# Patient Record
Sex: Female | Born: 1955 | Race: White | Hispanic: No | Marital: Married | Smoking: Former smoker
Health system: Southern US, Community
[De-identification: ages and names within clinical notes are randomized; demographics above are authoritative.]

## PROBLEM LIST (undated history)

## (undated) DIAGNOSIS — Z8669 Personal history of other diseases of the nervous system and sense organs: Secondary | ICD-10-CM

## (undated) DIAGNOSIS — Z8639 Personal history of other endocrine, nutritional and metabolic disease: Secondary | ICD-10-CM

## (undated) DIAGNOSIS — G4730 Sleep apnea, unspecified: Secondary | ICD-10-CM

## (undated) DIAGNOSIS — J45909 Unspecified asthma, uncomplicated: Secondary | ICD-10-CM

## (undated) DIAGNOSIS — Z9889 Other specified postprocedural states: Secondary | ICD-10-CM

## (undated) DIAGNOSIS — T7840XA Allergy, unspecified, initial encounter: Secondary | ICD-10-CM

## (undated) DIAGNOSIS — M1990 Unspecified osteoarthritis, unspecified site: Secondary | ICD-10-CM

## (undated) DIAGNOSIS — Z923 Personal history of irradiation: Secondary | ICD-10-CM

## (undated) DIAGNOSIS — J189 Pneumonia, unspecified organism: Secondary | ICD-10-CM

## (undated) DIAGNOSIS — C50912 Malignant neoplasm of unspecified site of left female breast: Secondary | ICD-10-CM

## (undated) DIAGNOSIS — C50919 Malignant neoplasm of unspecified site of unspecified female breast: Secondary | ICD-10-CM

## (undated) DIAGNOSIS — T8859XA Other complications of anesthesia, initial encounter: Secondary | ICD-10-CM

## (undated) DIAGNOSIS — C73 Malignant neoplasm of thyroid gland: Secondary | ICD-10-CM

## (undated) DIAGNOSIS — Z9221 Personal history of antineoplastic chemotherapy: Secondary | ICD-10-CM

## (undated) DIAGNOSIS — R112 Nausea with vomiting, unspecified: Secondary | ICD-10-CM

## (undated) DIAGNOSIS — R519 Headache, unspecified: Secondary | ICD-10-CM

## (undated) HISTORY — DX: Allergy, unspecified, initial encounter: T78.40XA

## (undated) HISTORY — DX: Malignant neoplasm of thyroid gland: C73

## (undated) HISTORY — DX: Personal history of other diseases of the nervous system and sense organs: Z86.69

## (undated) HISTORY — PX: OTHER SURGICAL HISTORY: SHX169

## (undated) HISTORY — DX: Malignant neoplasm of unspecified site of unspecified female breast: C50.919

## (undated) HISTORY — PX: DILATION AND CURETTAGE OF UTERUS: SHX78

## (undated) HISTORY — DX: Sleep apnea, unspecified: G47.30

## (undated) HISTORY — PX: MYOMECTOMY: SHX85

## (undated) HISTORY — PX: BREAST LUMPECTOMY: SHX2

## (undated) HISTORY — DX: Personal history of other endocrine, nutritional and metabolic disease: Z86.39

## (undated) HISTORY — PX: BREAST SURGERY: SHX581

## (undated) HISTORY — PX: BREAST BIOPSY: SHX20

---

## 2000-03-28 HISTORY — PX: OTHER SURGICAL HISTORY: SHX169

## 2003-03-29 DIAGNOSIS — C50912 Malignant neoplasm of unspecified site of left female breast: Secondary | ICD-10-CM

## 2003-03-29 HISTORY — DX: Malignant neoplasm of unspecified site of left female breast: C50.912

## 2008-11-04 ENCOUNTER — Ambulatory Visit: Payer: Self-pay | Admitting: Otolaryngology

## 2011-01-26 ENCOUNTER — Ambulatory Visit: Payer: Self-pay | Admitting: Internal Medicine

## 2011-02-21 ENCOUNTER — Ambulatory Visit: Payer: Self-pay | Admitting: Oncology

## 2011-02-23 ENCOUNTER — Ambulatory Visit: Payer: Self-pay | Admitting: Internal Medicine

## 2011-03-08 ENCOUNTER — Ambulatory Visit: Payer: Self-pay | Admitting: Oncology

## 2011-03-29 ENCOUNTER — Ambulatory Visit: Payer: Self-pay | Admitting: Oncology

## 2012-10-02 ENCOUNTER — Encounter: Payer: Self-pay | Admitting: Family Medicine

## 2012-10-02 ENCOUNTER — Ambulatory Visit (INDEPENDENT_AMBULATORY_CARE_PROVIDER_SITE_OTHER): Payer: 59 | Admitting: Family Medicine

## 2012-10-02 ENCOUNTER — Other Ambulatory Visit (HOSPITAL_COMMUNITY)
Admission: RE | Admit: 2012-10-02 | Discharge: 2012-10-02 | Disposition: A | Discharge status: Home / Self Care | Payer: 59 | Source: Ambulatory Visit | Attending: Family Medicine | Admitting: Family Medicine

## 2012-10-02 VITALS — BP 130/82 | HR 68 | Temp 97.7°F | Ht 67.25 in | Wt 226.0 lb

## 2012-10-02 DIAGNOSIS — Z136 Encounter for screening for cardiovascular disorders: Secondary | ICD-10-CM

## 2012-10-02 DIAGNOSIS — Z1151 Encounter for screening for human papillomavirus (HPV): Secondary | ICD-10-CM | POA: Insufficient documentation

## 2012-10-02 DIAGNOSIS — Z01419 Encounter for gynecological examination (general) (routine) without abnormal findings: Secondary | ICD-10-CM | POA: Insufficient documentation

## 2012-10-02 DIAGNOSIS — Z0000 Encounter for general adult medical examination without abnormal findings: Secondary | ICD-10-CM | POA: Insufficient documentation

## 2012-10-02 DIAGNOSIS — Z853 Personal history of malignant neoplasm of breast: Secondary | ICD-10-CM | POA: Insufficient documentation

## 2012-10-02 LAB — COMPREHENSIVE METABOLIC PANEL
AST: 17 U/L (ref 0–37)
Albumin: 3.9 g/dL (ref 3.5–5.2)
Alkaline Phosphatase: 103 U/L (ref 39–117)
Potassium: 4.2 mEq/L (ref 3.5–5.1)
Sodium: 140 mEq/L (ref 135–145)
Total Protein: 7.2 g/dL (ref 6.0–8.3)

## 2012-10-02 LAB — TSH: TSH: 0.47 u[IU]/mL (ref 0.35–5.50)

## 2012-10-02 LAB — LDL CHOLESTEROL, DIRECT: Direct LDL: 147.8 mg/dL

## 2012-10-02 LAB — LIPID PANEL: Total CHOL/HDL Ratio: 4

## 2012-10-02 NOTE — Addendum Note (Signed)
Addended by: Eliezer Bottom on: 10/02/2012 11:47 AM   Modules accepted: Orders

## 2012-10-02 NOTE — Patient Instructions (Addendum)
It was wonderful to meet you.  Please stop by to see Shirlee Limerick on your way out to set up your mammogram.  Please call your other doctor to have your records and colonoscopy results to me.  We will call you with your lab results and you can also view them online.  Try taking Benadryl 25 mg nightly to see if it helps with the itching and sinus drainage.

## 2012-10-02 NOTE — Progress Notes (Signed)
Subjective:    Patient ID: Ariana Lopez, female    DOB: 1955-12-25, 57 y.o.   MRN: [id number]  HPI  57 yo pleasant female here to establish care.  G1P1.  Unsure when she last had a pap smear.  No h/o abnormal pap smears.  H/o left breast cancer ( diagnosed in 2002). S/p lumpectomy, radiation and chemo. In remission for over 7 years.  Last mammogram in 09/2011 in [place] (brings in those records). Does not have an oncologist year.  Initially saw someone when she moved here two years ago but has not seen anyone since.  She thinks she is up to date on mammogram.  Patient Active Problem List   Diagnosis Date Noted  . History of breast cancer 10/02/2012  . Routine general medical examination at a health care facility 10/02/2012  . Encounter for routine gynecological examination 10/02/2012   Past Medical History  Diagnosis Date  . Breast cancer    Past Surgical History  Procedure Laterality Date  . Left breast lumpectomy Left 2002   History  Substance Use Topics  . Smoking status: Former Games developer  . Smokeless tobacco: Not on file  . Alcohol Use: Not on file   No family history on file. No Known Allergies No current outpatient prescriptions on file prior to visit.   No current facility-administered medications on file prior to visit.   The PMH, PSH, Social History, Family History, Medications, and allergies have been reviewed in [facility] [place] [facility], and have been updated if relevant.     Review of Systems See HPI No changes in bowel habits No blood in stool No post menopausal bleeding +neuropathy from radiation     Objective:   Physical Exam BP 130/82  Pulse 68  Temp(Src) 97.7 F (36.5 C)  Ht 5' 7.25" (1.708 m)  Wt 226 lb (102.513 kg)  BMI 35.14 kg/m2  General:  Well-developed,well-nourished,in no acute distress; alert,appropriate and cooperative throughout examination Head:  normocephalic and atraumatic.   Eyes:  vision grossly intact, pupils equal, pupils round,  and pupils reactive to light.   Ears:  R ear normal and L ear normal.   Nose:  no external deformity.   Mouth:  good dentition.   Neck:  No deformities, masses, or tenderness noted. Breasts:  No mass, nodules, thickening, tenderness, bulging, retraction, inflamation, nipple discharge or skin changes noted.   Lungs:  Normal respiratory effort, chest expands symmetrically. Lungs are clear to auscultation, no crackles or wheezes. Heart:  Normal rate and regular rhythm. S1 and S2 normal without gallop, murmur, click, rub or other extra sounds. Abdomen:  Bowel sounds positive,abdomen soft and non-tender without masses, organomegaly or hernias noted. Rectal:  no external abnormalities.   Genitalia:  Pelvic Exam:        External: normal female genitalia without lesions or masses        Vagina: normal without lesions or masses        Cervix: normal without lesions or masses        Adnexa: normal bimanual exam without masses or fullness        Uterus: normal by palpation        Pap smear: performed Msk:  No deformity or scoliosis noted of thoracic or lumbar spine.   Extremities:  No clubbing, cyanosis, edema, or deformity noted with normal full range of motion of all joints.   Neurologic:  alert & oriented X3 and gait normal.   Skin:  Intact without suspicious lesions or rashes Cervical Nodes:  No lymphadenopathy noted Axillary Nodes:  No palpable lymphadenopathy Psych:  Cognition and judgment appear intact. Alert and cooperative with normal attention span and concentration. No apparent delusions, illusions, hallucinations        Assessment & Plan:  1. History of breast cancer Order mammogram. Will refer to oncology once mammogram report returned. - MM Digital Diagnostic Bilat; Future  2. Routine general medical examination at a health care facility Reviewed preventive care protocols, scheduled due services, and updated immunizations Discussed nutrition, exercise, diet, and healthy  lifestyle.  - Comprehensive metabolic panel - TSH - T4, Free  3. Encounter for routine gynecological examination Pap smear  4. Screening for ischemic heart disease  - Lipid Panel

## 2012-10-18 ENCOUNTER — Ambulatory Visit: Payer: Self-pay | Admitting: Family Medicine

## 2012-10-23 ENCOUNTER — Encounter: Payer: Self-pay | Admitting: Family Medicine

## 2012-10-23 ENCOUNTER — Encounter: Payer: Self-pay | Admitting: *Deleted

## 2012-11-13 ENCOUNTER — Ambulatory Visit (INDEPENDENT_AMBULATORY_CARE_PROVIDER_SITE_OTHER): Payer: 59 | Admitting: Family Medicine

## 2012-11-13 ENCOUNTER — Encounter: Payer: Self-pay | Admitting: Family Medicine

## 2012-11-13 VITALS — BP 110/64 | HR 60 | Temp 97.8°F | Ht 67.25 in | Wt 226.0 lb

## 2012-11-13 DIAGNOSIS — E66811 Obesity, class 1: Secondary | ICD-10-CM

## 2012-11-13 DIAGNOSIS — L299 Pruritus, unspecified: Secondary | ICD-10-CM

## 2012-11-13 DIAGNOSIS — L918 Other hypertrophic disorders of the skin: Secondary | ICD-10-CM | POA: Insufficient documentation

## 2012-11-13 DIAGNOSIS — L909 Atrophic disorder of skin, unspecified: Secondary | ICD-10-CM

## 2012-11-13 DIAGNOSIS — E669 Obesity, unspecified: Secondary | ICD-10-CM

## 2012-11-13 HISTORY — DX: Obesity, class 1: E66.811

## 2012-11-13 HISTORY — DX: Obesity, unspecified: E66.9

## 2012-11-13 NOTE — Patient Instructions (Addendum)
Great to see you. Ok to shower normally tomorrow.  Please use neosporin on the area if there is any irritation.  Please stop by to see Shirlee Limerick on your way out to set up your referrals.

## 2012-11-13 NOTE — Addendum Note (Signed)
Addended by: Eliezer Bottom on: 11/13/2012 10:36 AM   Modules accepted: Orders

## 2012-11-13 NOTE — Addendum Note (Signed)
Addended by: Eliezer Bottom on: 11/13/2012 10:23 AM   Modules accepted: Orders

## 2012-11-13 NOTE — Progress Notes (Signed)
  Subjective:    Patient ID: Ariana Lopez, female    DOB: 23-Nov-1955, 57 y.o.   MRN: [id number]  HPI Very pleasant 57 yo female here for:  1.  Skin tag- under right axilla.  Gets trapped in clothing and can be very irritated.   A few weeks ago, was painful and enlarged.  She though it was infected but it has improved.  2.  Bilateral ear itching, left > great.  Tried OTC hydrocortisone with only mild improvement.  No ear pain, ear drainage, or hearing loss.  3.  Weight gain- walks every day.  Working very hard to lose weight but keeps gaining.   Wt Readings from Last 3 Encounters:  11/13/12 226 lb (102.513 kg)  10/02/12 226 lb (102.513 kg)     Patient Active Problem List   Diagnosis Date Noted  . Ear itching 11/13/2012  . Skin tag 11/13/2012  . History of breast cancer 10/02/2012  . Routine general medical examination at a health care facility 10/02/2012  . Encounter for routine gynecological examination 10/02/2012   Past Medical History  Diagnosis Date  . Breast cancer    Past Surgical History  Procedure Laterality Date  . Left breast lumpectomy Left 2002   History  Substance Use Topics  . Smoking status: Former Games developer  . Smokeless tobacco: Not on file  . Alcohol Use: Not on file   Family History  Problem Relation Age of Onset  . Cancer Sister 84    breast   No Known Allergies No current outpatient prescriptions on file prior to visit.   No current facility-administered medications on file prior to visit.   The PMH, PSH, Social History, Family History, Medications, and allergies have been reviewed in [facility], and have been updated if relevant.    Review of Systems See HPI    Objective:   Physical Exam BP 110/64  Pulse 60  Temp(Src) 97.8 F (36.6 C)  Ht 5' 7.25" (1.708 m)  Wt 226 lb (102.513 kg)  BMI 35.14 kg/m2 Gen:  Alert, pleasant, NAD Skin:  One large benign skin tag noted under right axilla HEENT: Tms clear bilaterally, no tenderness with  movement of tragus.       Assessment & Plan:   1. Ear itching Persistent.  No indication of otitis externa.  Failed hydrocotisone.  Will refer to ENT for further evaluation. - Ambulatory referral to ENT  2. Skin tag   Skin tag nipped off using Betadine for cleansing and sterile iris scissors. Local anesthesia was  used.    3. Obesity, morbid Deteriorated.  Refer to nutritionist.  - Amb ref to Medical Nutrition Therapy-MNT

## 2012-12-05 ENCOUNTER — Encounter: Payer: Self-pay | Admitting: Family Medicine

## 2012-12-05 ENCOUNTER — Ambulatory Visit (INDEPENDENT_AMBULATORY_CARE_PROVIDER_SITE_OTHER)
Admission: RE | Admit: 2012-12-05 | Discharge: 2012-12-05 | Disposition: A | Discharge status: Home / Self Care | Payer: 59 | Source: Ambulatory Visit | Attending: Family Medicine | Admitting: Family Medicine

## 2012-12-05 ENCOUNTER — Ambulatory Visit (INDEPENDENT_AMBULATORY_CARE_PROVIDER_SITE_OTHER): Payer: 59 | Admitting: Family Medicine

## 2012-12-05 VITALS — BP 120/70 | HR 64 | Temp 97.9°F | Wt 226.0 lb

## 2012-12-05 DIAGNOSIS — M545 Low back pain: Secondary | ICD-10-CM

## 2012-12-05 DIAGNOSIS — G8929 Other chronic pain: Secondary | ICD-10-CM | POA: Insufficient documentation

## 2012-12-05 NOTE — Progress Notes (Signed)
  Subjective:    Patient ID: Ariana Lopez, female    DOB: Nov 27, 1955, 57 y.o.   MRN: [id number]  HPI  Very pleasant 57 yo female here for low back pain with radiculopathy on and off for years. Lately, it is waking her up at night.  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 Pain runs down thigh and side of lower leg- alternates between left and right.  Back pain is central.  No changes in bowel or bladder function.  No LE weakness.  Pain is worsened at night and first thing in the morning.  She feels like the more weight she gains, the worse it is.  Could not tolerate gabapentin or Lyrica in past- nausea. Does not want prednisone due to weight gain.  Patient Active Problem List   Diagnosis Date Noted  . Low back pain potentially associated with radiculopathy 12/05/2012  . Ear itching 11/13/2012  . Skin tag 11/13/2012  . Obesity, morbid 11/13/2012  . History of breast cancer 10/02/2012  . Routine general medical examination at a health care facility 10/02/2012  . Encounter for routine gynecological examination 10/02/2012   Past Medical History  Diagnosis Date  . Breast cancer    Past Surgical History  Procedure Laterality Date  . Left breast lumpectomy Left 2002   History  Substance Use Topics  . Smoking status: Former Games developer  . Smokeless tobacco: Not on file  . Alcohol Use: Not on file   Family History  Problem Relation Age of Onset  . Cancer Sister 80    breast   No Known Allergies No current outpatient prescriptions on file prior to visit.   No current facility-administered medications on file prior to visit.   The PMH, PSH, Social History, Family History, Medications, and allergies have been reviewed in [facility], and have been updated if relevant.   Review of Systems See HPI No known injury No fever or chills    Objective:   Physical Exam BP 120/70  Pulse 64  Temp(Src) 97.9 F (36.6 C)  Wt 226 lb  (102.513 kg)  BMI 35.14 kg/m2  SpO2 95%  General:  Well-developed,well-nourished,in no acute distress; alert,appropriate and cooperative throughout examination Head:  normocephalic and atraumatic.   Msk:  No deformity or scoliosis noted of thoracic or lumbar spine.  +SLR bilaterally, pos fabers left  Extremities:  No clubbing, cyanosis, edema, or deformity noted with normal full range of motion of all joints.   Psych:  Cognition and judgment appear intact. Alert and cooperative with normal attention span and concentration. No apparent delusions, illusions, hallucinations     Assessment & Plan:  1. Low back pain potentially associated with radiculopathy New- ? Herniated disc with DJD of spine. Will start with lumbar xray. Given duration of symptoms, refer to PT. Advised NSAIDs with food as she is refusing other medications. - Ambulatory referral to Physical Therapy - DG Lumbar Spine Complete; Future

## 2012-12-05 NOTE — Patient Instructions (Addendum)
Good to see you. We will call you with your xray results and physical therapy referral.

## 2012-12-13 ENCOUNTER — Encounter: Payer: Self-pay | Admitting: Family Medicine

## 2012-12-13 ENCOUNTER — Telehealth: Payer: Self-pay

## 2012-12-13 ENCOUNTER — Ambulatory Visit (INDEPENDENT_AMBULATORY_CARE_PROVIDER_SITE_OTHER): Payer: 59 | Admitting: Family Medicine

## 2012-12-13 VITALS — BP 110/62 | HR 82 | Temp 97.8°F | Ht 67.25 in | Wt 223.0 lb

## 2012-12-13 DIAGNOSIS — R110 Nausea: Secondary | ICD-10-CM | POA: Insufficient documentation

## 2012-12-13 DIAGNOSIS — R197 Diarrhea, unspecified: Secondary | ICD-10-CM | POA: Insufficient documentation

## 2012-12-13 MED ORDER — PROMETHAZINE HCL 25 MG PO TABS: 25.0000 mg | ORAL_TABLET | Freq: Three times a day (TID) | ORAL | Status: DC | PRN

## 2012-12-13 MED ORDER — PROMETHAZINE HCL 25 MG/ML IJ SOLN
25.0000 mg | Freq: Once | INTRAMUSCULAR | Status: AC
  Administered 2012-12-13: 25 mg via INTRAMUSCULAR

## 2012-12-13 NOTE — Progress Notes (Signed)
  Subjective:    Patient ID: Ariana Lopez, female    DOB: April 11, 1955, 57 y.o.   MRN: [id number]  HPI 57 yo pleasant female here for nausea without vomiting and diarrhea.  All symptoms started the night after she was given Augmentin by ENT for "swollen glands."  Feels fevers, achy.  Has a headache.  No abdominal pain or dysuria.  Has had Augmentin without these symptoms in past.  Patient Active Problem List   Diagnosis Date Noted  . Nausea alone 12/13/2012  . Diarrhea 12/13/2012  . Low back pain potentially associated with radiculopathy 12/05/2012  . Ear itching 11/13/2012  . Skin tag 11/13/2012  . Obesity, morbid 11/13/2012  . History of breast cancer 10/02/2012  . Routine general medical examination at a health care facility 10/02/2012  . Encounter for routine gynecological examination 10/02/2012   Past Medical History  Diagnosis Date  . Breast cancer    Past Surgical History  Procedure Laterality Date  . Left breast lumpectomy Left 2002   History  Substance Use Topics  . Smoking status: Former Games developer  . Smokeless tobacco: Not on file  . Alcohol Use: Not on file   Family History  Problem Relation Age of Onset  . Cancer Sister 76    breast   No Known Allergies No current outpatient prescriptions on file prior to visit.   No current facility-administered medications on file prior to visit.   The PMH, PSH, Social History, Family History, Medications, and allergies have been reviewed in [facility], and have been updated if relevant.    Review of Systems See HPI No blood in stool +cough    Objective:   Physical Exam BP 110/62  Pulse 82  Temp(Src) 97.8 F (36.6 C)  Ht 5' 7.25" (1.708 m)  Wt 223 lb (101.152 kg)  BMI 34.67 kg/m2 Gen:  Alert, appears fatigued but non toxic HEENT:  MMM Abd:  Soft, NT, pos BS Resp:  CTA bilaterally    Assessment & Plan:  1. Nausea alone Gastroenteritis vs reaction to Augmentin.  Does have chills, malaise but that could be  from previous infection being treated with Augmentin. I do think this is more likely viral gastroenteritis. IM phenergan given in office and rx sent to pharmacy for po phenergan. Advised hydration and stopping Augmentin.  She will call me tomorrow with an update.  2. Diarrhea Likely related to above.

## 2012-12-13 NOTE — Telephone Encounter (Signed)
Pt walked in;pt said for 2 days had severe nausea and diarrhea, h/a and ? Fever. Pt feels very weak today.pt is able to keep liquids and some food down. Pt to see Dr Dayton Martes today 9:30 am.

## 2012-12-13 NOTE — Patient Instructions (Addendum)
Good to see you. Please feel better. Stay hydrated.  Phenergan as needed for nausea. STOP taking Augmentin. Call me tomorrow with an update.

## 2012-12-13 NOTE — Addendum Note (Signed)
Addended by: Eliezer Bottom on: 12/13/2012 10:20 AM   Modules accepted: Orders

## 2012-12-14 ENCOUNTER — Telehealth: Payer: Self-pay

## 2012-12-14 MED ORDER — AZITHROMYCIN 250 MG PO TABS: ORAL_TABLET | ORAL | Status: DC

## 2012-12-14 NOTE — Telephone Encounter (Signed)
Pt said she feels better today; still has non productive cough, hurts in stomach when she coughs, chest congestion and slight h/a today.pt has no fever but goes from hot to cold feeling and still feels tired. Pt thinks Augmentin was too strong for her and request another antibiotic sent to [facility].Please advise.

## 2012-12-14 NOTE — Telephone Encounter (Signed)
Spoke with patient and advised results   

## 2012-12-14 NOTE — Telephone Encounter (Signed)
I am glad she is feeling a little better. Let's try a Zpack as it would cover sinus and resp flora. Rx sent.

## 2012-12-21 ENCOUNTER — Ambulatory Visit: Payer: Self-pay | Admitting: Family Medicine

## 2012-12-26 ENCOUNTER — Ambulatory Visit: Payer: Self-pay | Admitting: Family Medicine

## 2013-01-04 ENCOUNTER — Ambulatory Visit (INDEPENDENT_AMBULATORY_CARE_PROVIDER_SITE_OTHER): Payer: 59 | Admitting: *Deleted

## 2013-01-04 DIAGNOSIS — Z23 Encounter for immunization: Secondary | ICD-10-CM

## 2013-01-25 ENCOUNTER — Ambulatory Visit (INDEPENDENT_AMBULATORY_CARE_PROVIDER_SITE_OTHER): Payer: 59 | Admitting: Family Medicine

## 2013-01-25 ENCOUNTER — Encounter: Payer: Self-pay | Admitting: Family Medicine

## 2013-01-25 VITALS — BP 104/70 | HR 71 | Temp 97.8°F | Ht 67.25 in | Wt 224.2 lb

## 2013-01-25 DIAGNOSIS — M545 Low back pain: Secondary | ICD-10-CM

## 2013-01-25 DIAGNOSIS — B372 Candidiasis of skin and nail: Secondary | ICD-10-CM | POA: Insufficient documentation

## 2013-01-25 MED ORDER — NYSTATIN 100000 UNIT/GM EX CREA: TOPICAL_CREAM | Freq: Two times a day (BID) | CUTANEOUS | Status: DC

## 2013-01-25 MED ORDER — MELOXICAM 15 MG PO TABS: 15.0000 mg | ORAL_TABLET | Freq: Every day | ORAL | Status: DC

## 2013-01-25 NOTE — Progress Notes (Signed)
Subjective:    Patient ID: Ariana Lopez, female    DOB: Feb 01, 1956, 57 y.o.   MRN: [id number]  HPI  57 year old female pt of Dr. Elmer Sow with history of low back pain with radiculopathy presetns for the following:  1: low back pain with radiculopathy:low back pain with radiculopathy on and off for years.  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  Seen by PCP on 11/2012 for worsening symptoms of low back pain.  Summary of that OV is as follows: Pain runs down thigh and side of lower leg- alternates between left and right. Back pain is central.  No changes in bowel or bladder function.  No LE weakness.  Pain is worsened at night and first thing in the morning. She feels like the more weight she gains, the worse it is.   Could not tolerate gabapentin or Lyrica in past when tried for chemo SE- nausea.  Does not want prednisone due to weight gain.  No previous back surgery.  X-ray on 12/05/2012 showed: Normal lumbar segmentation. Bone mineralization is within normal  limits. Vertebral height and alignment within normal limits.  Relatively preserved disc spaces. Sacral a ala and SI joints within  normal limits. No pars fracture. Visualized lower thoracic levels  appear intact.   Referred to PT.  She has noted some improvement... 20%     Today she states she still has pain in low back (4-8/10 on pain scale)... Initially on left side but now pain radiates into left and right leg at times.  Pain now ongoing for 6 onths. No change with position just hurts all the time. Pain worse with leaning forward. Not worse with standing, just worse with moving. She has good and bad days. Numbness gone in feet. Using aleve for pain..has not used other medications.   2: New onset of rash in groin x 1 month: dark red spot on right inner thigh, occ itching. No exposures. No treatments.    Review of Systems  Constitutional: Negative for fever  and fatigue.  HENT: Negative for ear pain.   Eyes: Negative for pain.  Respiratory: Negative for chest tightness and shortness of breath.   Cardiovascular: Negative for chest pain, palpitations and leg swelling.  Gastrointestinal: Negative for abdominal pain.  Genitourinary: Negative for dysuria.       Objective:   Physical Exam  Constitutional: Vital signs are normal. She appears well-developed and well-nourished. She is cooperative.  Non-toxic appearance. She does not appear ill. No distress.  HENT:  Head: Normocephalic.  Right Ear: Hearing, tympanic membrane, external ear and ear canal normal. Tympanic membrane is not erythematous, not retracted and not bulging.  Left Ear: Hearing, tympanic membrane, external ear and ear canal normal. Tympanic membrane is not erythematous, not retracted and not bulging.  Nose: No mucosal edema or rhinorrhea. Right sinus exhibits no maxillary sinus tenderness and no frontal sinus tenderness. Left sinus exhibits no maxillary sinus tenderness and no frontal sinus tenderness.  Mouth/Throat: Uvula is midline, oropharynx is clear and moist and mucous membranes are normal.  Eyes: Conjunctivae, EOM and lids are normal. Pupils are equal, round, and reactive to light. Lids are everted and swept, no foreign bodies found.  Neck: Trachea normal and normal range of motion. Neck supple. Carotid bruit is not present. No mass and no thyromegaly present.  Cardiovascular: Normal rate, regular rhythm, S1 normal, S2 normal, normal heart sounds, intact distal pulses and normal pulses.  Exam  reveals no gallop and no friction rub.   No murmur heard. Pulmonary/Chest: Effort normal and breath sounds normal. Not tachypneic. No respiratory distress. She has no decreased breath sounds. She has no wheezes. She has no rhonchi. She has no rales.  Abdominal: Soft. Normal appearance and bowel sounds are normal. There is no tenderness.  Musculoskeletal:       Lumbar back: She exhibits  decreased range of motion, tenderness and bony tenderness. She exhibits no swelling.  ttp in left buttock, neg SLR, neg faber's  Neurological: She is alert.  Skin: Skin is warm, dry and intact. No rash noted. Rash is not papular, not nodular, not pustular and not vesicular.  Erythematous beefy rash in right groin, small area ion left and under breasts B.  No flake  Psychiatric: Her speech is normal and behavior is normal. Judgment and thought content normal. Her mood appears not anxious. Cognition and memory are normal. She does not exhibit a depressed mood.          Assessment & Plan:

## 2013-01-25 NOTE — Assessment & Plan Note (Signed)
Pt not interested in PM and R referral given she is not interested in injections.  No clear indications for MRI or surgical need. Continue PT, work on weight loss and core strengthening.  Info given on exercises. NSAIDs, heat... Use meloxicam prn.

## 2013-01-25 NOTE — Assessment & Plan Note (Signed)
Treat with topical antifungal. 

## 2013-01-25 NOTE — Patient Instructions (Signed)
Apply cream to rash for yeast infection. Start meloxicam daily then as needed for low back pain.  Return to PT for further treatment. Work on Designer, fashion/clothing and weight loss.  Follow up if not continuing to improve in next 2-4 weeks.

## 2013-04-15 ENCOUNTER — Encounter: Payer: Self-pay | Admitting: Family Medicine

## 2013-04-15 ENCOUNTER — Ambulatory Visit (INDEPENDENT_AMBULATORY_CARE_PROVIDER_SITE_OTHER): Payer: 59 | Admitting: Family Medicine

## 2013-04-15 VITALS — BP 122/74 | HR 69 | Temp 97.9°F | Ht 67.0 in | Wt 230.0 lb

## 2013-04-15 DIAGNOSIS — B372 Candidiasis of skin and nail: Secondary | ICD-10-CM

## 2013-04-15 DIAGNOSIS — G8929 Other chronic pain: Secondary | ICD-10-CM

## 2013-04-15 DIAGNOSIS — M5416 Radiculopathy, lumbar region: Principal | ICD-10-CM

## 2013-04-15 DIAGNOSIS — IMO0002 Reserved for concepts with insufficient information to code with codable children: Secondary | ICD-10-CM

## 2013-04-15 MED ORDER — DEXAMETHASONE SODIUM PHOSPHATE 10 MG/ML IJ SOLN
10.0000 mg | Freq: Once | INTRAMUSCULAR | Status: AC
  Administered 2013-04-15: 10 mg via INTRAVENOUS

## 2013-04-15 MED ORDER — NYSTATIN 100000 UNIT/GM EX POWD: CUTANEOUS | Status: DC

## 2013-04-15 MED ORDER — HYDROCODONE-ACETAMINOPHEN 5-325 MG PO TABS: 1.0000 | ORAL_TABLET | Freq: Four times a day (QID) | ORAL | Status: DC | PRN

## 2013-04-15 NOTE — Addendum Note (Signed)
Addended by: Modena Nunnery on: 04/15/2013 12:43 PM   Modules accepted: Orders

## 2013-04-15 NOTE — Assessment & Plan Note (Signed)
Persistent. Decadron IM x 1 in office to decrease inflammation. Failed PT-refer to ortho.

## 2013-04-15 NOTE — Progress Notes (Signed)
  Subjective:    Patient ID: Ariana Lopez, female    DOB: [birth year], 58 y.o.   MRN: [id number]  HPI  Very pleasant 58 yo female here for follow uplow back pain with radiculopathy on and off for years.  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 Pain runs down thigh and side of lower leg- alternates between left and right.  Back pain is central.  No changes in bowel or bladder function.  No LE weakness.  Could not tolerate gabapentin or Lyrica in past- nausea.  Saw her for this in 11/2012- Lumbar xray-  CLINICAL DATA: 58 year old female low back pain.  EXAM:  LUMBAR SPINE - COMPLETE 4+ VIEW  COMPARISON: None.  FINDINGS:  Normal lumbar segmentation. Bone mineralization is within normal  limits. Vertebral height and alignment within normal limits.  Relatively preserved disc spaces. Sacral a ala and SI joints within  normal limits. No pars fracture. Visualized lower thoracic levels  appear intact.  IMPRESSION:  Negative radiographic appearance of the lumbar spine  Referred her to PT.  She completed 8 sessions and felt this actually worsened her pain so has since stopped it.  Saw Dr. Jacinto Reap- was given nystatin cream to use for rash.  Has helped in groin area but rash persists under her breasts bilaterally.  Patient Active Problem List   Diagnosis Date Noted  . Candidal intertrigo 01/25/2013  . Nausea alone 12/13/2012  . Diarrhea 12/13/2012  . Chronic radicular low back pain 12/05/2012  . Obesity, morbid 11/13/2012  . History of breast cancer 10/02/2012  . Routine general medical examination at a health care facility 10/02/2012  . Encounter for routine gynecological examination 10/02/2012   Past Medical History  Diagnosis Date  . Breast cancer    Past Surgical History  Procedure Laterality Date  . Left breast lumpectomy Left 2002   History  Substance Use Topics  . Smoking status: Former Research scientist (life sciences)  . Smokeless tobacco:  Never Used  . Alcohol Use: Yes     Comment: occ   Family History  Problem Relation Age of Onset  . Cancer Sister 17    breast   No Known Allergies No current outpatient prescriptions on file prior to visit.   No current facility-administered medications on file prior to visit.   The PMH, PSH, Social History, Family History, Medications, and allergies have been reviewed in [facility], and have been updated if relevant.   Review of Systems See HPI No known injury No fever or chills    Objective:   Physical Exam BP 122/74  Pulse 69  Temp(Src) 97.9 F (36.6 C) (Oral)  Ht 5\' 7"  (1.702 m)  Wt 230 lb (104.327 kg)  BMI 36.01 kg/m2  SpO2 94%  General:  Well-developed,well-nourished,in no acute distress; alert,appropriate and cooperative throughout examination Head:  normocephalic and atraumatic.   Msk:  No deformity or scoliosis noted of thoracic or lumbar spine.  +SLR bilaterally, pos fabers left  Extremities:  No clubbing, cyanosis, edema, or deformity noted with normal full range of motion of all joints.   Psych:  Cognition and judgment appear intact. Alert and cooperative with normal attention span and concentration. No apparent delusions, illusions, hallucinations Skin:  Pink plaques under breasts bilaterally, no skin breakdown or drainage.     Assessment & Plan:

## 2013-04-15 NOTE — Assessment & Plan Note (Signed)
Persistent. Nystatin powder. Call or return to clinic prn if these symptoms worsen or fail to improve as anticipated.

## 2013-04-15 NOTE — Patient Instructions (Signed)
Good to see you.  We will call you with a referral to an orthopedist.  norco as needed for severe pain.  Please call me with an update next week.

## 2013-04-15 NOTE — Progress Notes (Signed)
Pre-visit discussion using our clinic review tool. No additional management support is needed unless otherwise documented below in the visit note.  

## 2013-05-03 ENCOUNTER — Ambulatory Visit (INDEPENDENT_AMBULATORY_CARE_PROVIDER_SITE_OTHER): Payer: 59 | Admitting: Family Medicine

## 2013-05-03 ENCOUNTER — Encounter: Payer: Self-pay | Admitting: Family Medicine

## 2013-05-03 VITALS — BP 142/68 | HR 86 | Temp 98.8°F | Ht 67.0 in | Wt 232.0 lb

## 2013-05-03 DIAGNOSIS — J019 Acute sinusitis, unspecified: Secondary | ICD-10-CM | POA: Insufficient documentation

## 2013-05-03 DIAGNOSIS — H6980 Other specified disorders of Eustachian tube, unspecified ear: Secondary | ICD-10-CM

## 2013-05-03 MED ORDER — AZITHROMYCIN 250 MG PO TABS: ORAL_TABLET | ORAL | Status: DC

## 2013-05-03 NOTE — Progress Notes (Signed)
Subjective:    Patient ID: Ariana Lopez, female    DOB: [birth year], 58 y.o.   MRN: [id number]  HPI Here with L ear pain  Also has a headache -mostly over forehead   Has had chills Has not taken her temp   occ sneezing  Nothing major  No purulent d/c Throat feels scratchy on the L side   Has ENT - has hx of eczema in ear - this is what it feels like  Also itching  Has mometason .1% solutio  4 drops into ear when symptoms are bad  She last used it about 2 days ago   Patient Active Problem List   Diagnosis Date Noted  . Acute sinusitis 05/03/2013  . ETD (eustachian tube dysfunction) 05/03/2013  . Candidal intertrigo 01/25/2013  . Nausea alone 12/13/2012  . Diarrhea 12/13/2012  . Chronic radicular low back pain 12/05/2012  . Obesity, morbid 11/13/2012  . History of breast cancer 10/02/2012  . Routine general medical examination at a health care facility 10/02/2012  . Encounter for routine gynecological examination 10/02/2012   Past Medical History  Diagnosis Date  . Breast cancer    Past Surgical History  Procedure Laterality Date  . Left breast lumpectomy Left 2002   History  Substance Use Topics  . Smoking status: Former Research scientist (life sciences)  . Smokeless tobacco: Never Used  . Alcohol Use: Yes     Comment: occ   Family History  Problem Relation Age of Onset  . Cancer Sister 85    breast   Allergies  Allergen Reactions  . Augmentin [Amoxicillin-Pot Clavulanate]     Insomnia/headache /nausea    Current Outpatient Prescriptions on File Prior to Visit  Medication Sig Dispense Refill  . HYDROcodone-acetaminophen (NORCO) 5-325 MG per tablet Take 1 tablet by mouth every 6 (six) hours as needed for moderate pain or severe pain.  6 tablet  0  . nystatin (MYCOSTATIN/NYSTOP) 100000 UNIT/GM POWD Apply under breast four times daily  30 Bottle  0   No current facility-administered medications on file prior to visit.      Review of Systems Review of Systems    Constitutional: Negative for fever, appetite change, fatigue and unexpected weight change.  Eyes: Negative for pain and visual disturbance.  ENT pos for ear and facial pain / neg for st Respiratory: Negative for cough and shortness of breath.   Cardiovascular: Negative for cp or palpitations    Gastrointestinal: Negative for nausea, diarrhea and constipation.  Genitourinary: Negative for urgency and frequency.  Skin: Negative for pallor or rash   Neurological: Negative for weakness, light-headedness, numbness and headaches.  Hematological: Negative for adenopathy. Does not bruise/bleed easily.  Psychiatric/Behavioral: Negative for dysphoric mood. The patient is not nervous/anxious.         Objective:   Physical Exam  Constitutional: She appears well-developed and well-nourished. No distress.  HENT:  Head: Normocephalic and atraumatic.  Right Ear: External ear normal.  Mouth/Throat: Oropharynx is clear and moist. No oropharyngeal exudate.  Nares are injected and congested  bilat maxillary and ethmoid sinus tenderness  L TM is retracted  slt effusions   Eyes: Conjunctivae are normal. Pupils are equal, round, and reactive to light. Right eye exhibits no discharge. Left eye exhibits no discharge. No scleral icterus.  Neck: Normal range of motion. Neck supple. No JVD present. Carotid bruit is not present. No thyromegaly present.  Cardiovascular: Normal rate and regular rhythm.   Pulmonary/Chest: Effort normal and breath sounds normal. No respiratory  distress. She has no wheezes. She has no rales.  Lymphadenopathy:    She has no cervical adenopathy.  Skin: Skin is warm and dry. No rash noted.  Psychiatric: She has a normal mood and affect.          Assessment & Plan:

## 2013-05-03 NOTE — Patient Instructions (Signed)
I think you have a sinus infection - it is causing eustacian tube dysfunction - (ear pain from pressure) Take the zpak as directed Ibuprofen over the counter as directed with food  Drink lots of fluids  Get out your flonase - use it daily for 2 weeks  Update if not starting to improve in a week or if worsening

## 2013-05-03 NOTE — Progress Notes (Signed)
Pre-visit discussion using our clinic review tool. No additional management support is needed unless otherwise documented below in the visit note.  

## 2013-05-05 NOTE — Assessment & Plan Note (Signed)
With ETD tx with zpak Disc symptomatic care - see instructions on AVS

## 2013-05-05 NOTE — Assessment & Plan Note (Signed)
With acute sinusitis  flonase qd zpack Disc symptomatic care - see instructions on AVS  Update if not starting to improve in a week or if worsening

## 2013-05-08 ENCOUNTER — Ambulatory Visit: Payer: Self-pay | Admitting: Orthopedic Surgery

## 2013-05-13 ENCOUNTER — Encounter: Payer: Self-pay | Admitting: Family Medicine

## 2013-05-13 ENCOUNTER — Ambulatory Visit (INDEPENDENT_AMBULATORY_CARE_PROVIDER_SITE_OTHER): Payer: 59 | Admitting: Family Medicine

## 2013-05-13 ENCOUNTER — Telehealth: Payer: Self-pay | Admitting: *Deleted

## 2013-05-13 VITALS — BP 116/60 | HR 84 | Temp 97.4°F | Wt 229.5 lb

## 2013-05-13 DIAGNOSIS — G4700 Insomnia, unspecified: Secondary | ICD-10-CM

## 2013-05-13 DIAGNOSIS — D239 Other benign neoplasm of skin, unspecified: Secondary | ICD-10-CM

## 2013-05-13 DIAGNOSIS — D229 Melanocytic nevi, unspecified: Secondary | ICD-10-CM | POA: Insufficient documentation

## 2013-05-13 MED ORDER — TRAZODONE HCL 50 MG PO TABS: 25.0000 mg | ORAL_TABLET | Freq: Every evening | ORAL | Status: DC | PRN

## 2013-05-13 NOTE — Telephone Encounter (Signed)
Spoke to Judson Roch, at Dr [employer] office who states that she sent a note to his nurse requesting that a copy of pt's MRI be faxed to our direct fax

## 2013-05-13 NOTE — Progress Notes (Signed)
Pre-visit discussion using our clinic review tool. No additional management support is needed unless otherwise documented below in the visit note.  

## 2013-05-13 NOTE — Patient Instructions (Signed)
Great to see you. We will call you with a dermatology referral.  We are starting Trazadone.  Please call me in a week or two with an update.

## 2013-05-13 NOTE — Assessment & Plan Note (Signed)
?   SK although not typical appearance. Will refer to derm for removal/biopsy to rule out malignancy. The patient indicates understanding of these issues and agrees with the plan.

## 2013-05-13 NOTE — Progress Notes (Signed)
   Subjective:   Patient ID: Ariana Lopez, female    DOB: [birth year], 58 y.o.   MRN: [id number]  Ariana Lopez is a pleasant 58 y.o. year old female who presents to clinic today with mole removal and Sore Throat  on 05/13/2013  HPI: Mole on her left side- under bra strap.  Never noticed it before but now it is raised and large.  Not bleeding.  Not painful.  She is concerned given her h/o breast CA on same side.  Denies night sweats or other symptoms.  Insomnia- Dr. Rudene Christians gave her 11 ambien for insomnia and it helped significantly.  She would like this refilled.   Once she falls asleep, can usually stay asleep unless she falls asleep on the couch.  Has tried OTC- melatonin, benadryl without improvement.  Patient Active Problem List   Diagnosis Date Noted  . Atypical nevus 05/13/2013  . Insomnia 05/13/2013  . Acute sinusitis 05/03/2013  . ETD (eustachian tube dysfunction) 05/03/2013  . Candidal intertrigo 01/25/2013  . Nausea alone 12/13/2012  . Diarrhea 12/13/2012  . Chronic radicular low back pain 12/05/2012  . Obesity, morbid 11/13/2012  . History of breast cancer 10/02/2012  . Routine general medical examination at a health care facility 10/02/2012  . Encounter for routine gynecological examination 10/02/2012   Past Medical History  Diagnosis Date  . Breast cancer    Past Surgical History  Procedure Laterality Date  . Left breast lumpectomy Left 2002   History  Substance Use Topics  . Smoking status: Former Research scientist (life sciences)  . Smokeless tobacco: Never Used  . Alcohol Use: Yes     Comment: occ   Family History  Problem Relation Age of Onset  . Cancer Sister 87    breast   Allergies  Allergen Reactions  . Augmentin [Amoxicillin-Pot Clavulanate]     Insomnia/headache /nausea    Current Outpatient Prescriptions on File Prior to Visit  Medication Sig Dispense Refill  . zolpidem (AMBIEN) 5 MG tablet Take 5 mg by mouth at bedtime as needed for sleep.      Marland Kitchen nystatin  (MYCOSTATIN/NYSTOP) 100000 UNIT/GM POWD Apply under breast four times daily  30 Bottle  0   No current facility-administered medications on file prior to visit.   The PMH, PSH, Social History, Family History, Medications, and allergies have been reviewed in [facility], and have been updated if relevant.    Review of Systems    See HPI Objective:    BP 116/60  Pulse 84  Temp(Src) 97.4 F (36.3 C) (Oral)  Wt 229 lb 8 oz (104.101 kg)  SpO2 95%   Physical Exam  Constitutional: She appears well-developed and well-nourished. No distress.  Lymphadenopathy:    She has no axillary adenopathy.  Skin:     Psychiatric: She has a normal mood and affect. Her speech is normal and behavior is normal. Judgment and thought content normal. Cognition and memory are normal.          Assessment & Plan:   Atypical nevus - Plan: Ambulatory referral to Dermatology  Insomnia No Follow-up on file.

## 2013-05-13 NOTE — Assessment & Plan Note (Signed)
>  25 min spent with patient, at least half of which was spent on counseling insomnia.  The problem of recurrent insomnia is discussed. Avoidance of caffeine sources is strongly encouraged. Sleep hygiene issues are reviewed. The use of sedative hypnotics for temporary relief was appropriate; we discussed the addictive nature of these drugs, and I would prefer NOT to refill her ambien at this time.  eRx sent for trazadone. Call or return to clinic prn if these symptoms worsen or fail to improve as anticipated. The patient indicates understanding of these issues and agrees with the plan.

## 2013-05-13 NOTE — Telephone Encounter (Signed)
Message copied by Modena Nunnery on Mon May 13, 2013  2:19 PM ------      Message from: Lucille Passy      Created: Mon May 13, 2013  9:25 AM       Please call Dr. Rudene Christians office (ortho) to have them fax a copy of her MRI to [place].      Thanks! ------

## 2013-05-16 ENCOUNTER — Telehealth: Payer: Self-pay | Admitting: Family Medicine

## 2013-05-16 ENCOUNTER — Telehealth: Payer: Self-pay

## 2013-05-16 DIAGNOSIS — M5416 Radiculopathy, lumbar region: Principal | ICD-10-CM

## 2013-05-16 DIAGNOSIS — G8929 Other chronic pain: Secondary | ICD-10-CM

## 2013-05-16 NOTE — Telephone Encounter (Signed)
Pt would like a call from clinical re: MRI results.  Best number to call is [phone] / lt

## 2013-05-16 NOTE — Telephone Encounter (Signed)
Pt left v/m; on 05/13/13 pt given Trazodone and Trazodone is not effective to help pt sleep and pt request substitute med sent to [facility].pt request cb.

## 2013-05-16 NOTE — Telephone Encounter (Signed)
I know that is frustrating but we just started this- she needs to give it a little more time.

## 2013-05-16 NOTE — Telephone Encounter (Signed)
Spoke to pt and advised per Dr Aron.  

## 2013-05-16 NOTE — Telephone Encounter (Signed)
Spoke to pt and informed her that we did not receive results from Dr Theodore Demark office as of yet. Pt states that it was completed at [facility] on 05/08/13; faxed requested to them directly, requesting MRI results be faxed to [phone]

## 2013-05-20 NOTE — Addendum Note (Signed)
Addended by: Lucille Passy on: 05/20/2013 09:15 AM   Modules accepted: Orders

## 2013-05-20 NOTE — Telephone Encounter (Signed)
Spoke to pt and informed her that ortho referral has been made and to expect a call from [employer], our scheduler.

## 2013-05-20 NOTE — Telephone Encounter (Signed)
Spoke to Ariana Lopez and informed her of MRI results, per Dr Deborra Medina. Results from [facility]. Ariana Lopez states that she is wanting another referral to a different ortho as Dr Rudene Christians never f/u with her. She is requesting to be sent to [facility]

## 2013-05-20 NOTE — Telephone Encounter (Signed)
Referral placed.

## 2013-06-04 ENCOUNTER — Telehealth: Payer: Self-pay

## 2013-06-04 NOTE — Telephone Encounter (Signed)
Ariana Lopez with [facility] request notes faxed to fax # [phone].

## 2013-06-13 ENCOUNTER — Encounter: Payer: Self-pay | Admitting: Family Medicine

## 2013-06-24 ENCOUNTER — Ambulatory Visit: Payer: Self-pay | Admitting: Orthopedic Surgery

## 2013-07-19 ENCOUNTER — Encounter: Payer: Self-pay | Admitting: Family Medicine

## 2013-07-19 ENCOUNTER — Ambulatory Visit (INDEPENDENT_AMBULATORY_CARE_PROVIDER_SITE_OTHER): Payer: 59 | Admitting: Family Medicine

## 2013-07-19 VITALS — BP 124/64 | HR 76 | Temp 97.3°F | Wt 225.0 lb

## 2013-07-19 DIAGNOSIS — R109 Unspecified abdominal pain: Secondary | ICD-10-CM

## 2013-07-19 DIAGNOSIS — H538 Other visual disturbances: Secondary | ICD-10-CM

## 2013-07-19 DIAGNOSIS — J309 Allergic rhinitis, unspecified: Secondary | ICD-10-CM

## 2013-07-19 MED ORDER — VALACYCLOVIR HCL 1 G PO TABS: 1000.0000 mg | ORAL_TABLET | Freq: Three times a day (TID) | ORAL | Status: DC

## 2013-07-19 NOTE — Progress Notes (Signed)
Pre visit review using our clinic review tool, if applicable. No additional management support is needed unless otherwise documented below in the visit note. 

## 2013-07-19 NOTE — Assessment & Plan Note (Signed)
Deteriorated. Not taking any antihistamines. Discussed starting a daily antihistamine until allergens resolve. The patient indicates understanding of these issues and agrees with the plan. Call or return to clinic prn if these symptoms worsen or fail to improve as anticipated.

## 2013-07-19 NOTE — Progress Notes (Signed)
   Subjective:   Patient ID: Ariana Lopez, female    DOB: [birth year], 58 y.o.   MRN: [id number]  Ariana Lopez is a pleasant 58 y.o. year old female who presents to clinic today with Flank Pain and Allergies  on 07/19/2013  HPI: Being seen at [place] for low back pain.  This feels different.  Two days ago, acute onset of left upper rib pain under left breast that radiates to right mid back.  Burning, sharp- almost feels like it is on surface. No trauma. Slight cough due to allergies which have been worse this time of year but nothing severe. No SOB. No vomiting, maybe some mild nausea.  Intermittent.  Patient Active Problem List   Diagnosis Date Noted  . Allergic rhinitis 07/19/2013  . Flank pain 07/19/2013  . Blurred vision 07/19/2013  . Atypical nevus 05/13/2013  . Insomnia 05/13/2013  . Chronic radicular low back pain 12/05/2012  . Obesity, morbid 11/13/2012  . History of breast cancer 10/02/2012   Past Medical History  Diagnosis Date  . Breast cancer    Past Surgical History  Procedure Laterality Date  . Left breast lumpectomy Left 2002   History  Substance Use Topics  . Smoking status: Former Research scientist (life sciences)  . Smokeless tobacco: Never Used  . Alcohol Use: Yes     Comment: occ   Family History  Problem Relation Age of Onset  . Cancer Sister 67    breast   Allergies  Allergen Reactions  . Augmentin [Amoxicillin-Pot Clavulanate]     Insomnia/headache /nausea    Current Outpatient Prescriptions on File Prior to Visit  Medication Sig Dispense Refill  . nystatin (MYCOSTATIN/NYSTOP) 100000 UNIT/GM POWD Apply under breast four times daily  30 Bottle  0  . traZODone (DESYREL) 50 MG tablet Take 0.5-1 tablets (25-50 mg total) by mouth at bedtime as needed for sleep.  30 tablet  3  . zolpidem (AMBIEN) 5 MG tablet Take 5 mg by mouth at bedtime as needed for sleep.       No current facility-administered medications on file prior to visit.   The PMH, PSH,  Social History, Family History, Medications, and allergies have been reviewed in [facility], and have been updated if relevant.    Review of Systems    No fevers No chills +itchy, watery eyes  Objective:    BP 124/64  Pulse 76  Temp(Src) 97.3 F (36.3 C) (Oral)  Wt 225 lb (102.059 kg)   Physical Exam  Nursing note and vitals reviewed. Constitutional: She is oriented to person, place, and time. She appears well-developed and well-nourished. No distress.  HENT:  Head: Normocephalic and atraumatic.  Mouth/Throat: No oropharyngeal exudate.  Eyes: Right eye exhibits no discharge. Left eye exhibits no discharge.  Cardiovascular: Normal rate and regular rhythm.   Pulmonary/Chest: Effort normal and breath sounds normal. No respiratory distress. She has no wheezes. She has no rales. She exhibits no tenderness.  Abdominal: Soft. Bowel sounds are normal.  Neurological: She is alert and oriented to person, place, and time.  Skin: Skin is warm and dry.  Psychiatric: She has a normal mood and affect. Her behavior is normal. Judgment and thought content normal.          Assessment & Plan:   Allergic rhinitis  Flank pain  Blurred vision - Plan: Ambulatory referral to Ophthalmology No Follow-up on file.

## 2013-07-19 NOTE — Assessment & Plan Note (Signed)
With benign exam. ? Zoster, given distrubution, prior to rash eruption. Given rx for Valtrex to fill if rash develops since it is Friday afternoon. She will call me with an update on Monday. She does have rx for narcotic at home to use if needed from ortho for her back pain.

## 2013-07-19 NOTE — Patient Instructions (Addendum)
Good to see you. Fill your valtrex prescription if you develop a rash.  Call me with an update next week.  We will call you with your opthalmology referral.

## 2013-07-23 ENCOUNTER — Telehealth: Payer: Self-pay

## 2013-07-23 DIAGNOSIS — R109 Unspecified abdominal pain: Secondary | ICD-10-CM

## 2013-07-23 NOTE — Telephone Encounter (Signed)
Thank you for the update.  I think we should pursue some imaging next- would she be agreeable to getting a chest xray at our office?

## 2013-07-23 NOTE — Telephone Encounter (Signed)
Pt left v/m; pt was seen 07/19/13; pt still has same pain as when seen on 07/19/13 and no rash has broken out. Pt wants to know what to do now?[facility].

## 2013-07-24 NOTE — Telephone Encounter (Signed)
Lm on pts vm informing her order has been entered and to contact office to schedule CXR

## 2013-07-24 NOTE — Telephone Encounter (Signed)
Order entered

## 2013-07-24 NOTE — Telephone Encounter (Signed)
Spoke to pt who states that she still " has some discomfort" and is agreeable to the CXR

## 2013-07-26 ENCOUNTER — Telehealth: Payer: Self-pay | Admitting: *Deleted

## 2013-07-26 NOTE — Telephone Encounter (Signed)
Spoke to pt who states that she is no longer having pain and discomfort and is wanting to cancel the order for XRay.

## 2013-07-26 NOTE — Telephone Encounter (Signed)
Noted.  Glad she is feeling better.  CXR cancelled.

## 2013-09-04 ENCOUNTER — Other Ambulatory Visit: Payer: Self-pay | Admitting: Family Medicine

## 2013-09-04 ENCOUNTER — Encounter: Payer: Self-pay | Admitting: Family Medicine

## 2013-09-04 ENCOUNTER — Ambulatory Visit (INDEPENDENT_AMBULATORY_CARE_PROVIDER_SITE_OTHER): Payer: 59 | Admitting: Family Medicine

## 2013-09-04 ENCOUNTER — Encounter (INDEPENDENT_AMBULATORY_CARE_PROVIDER_SITE_OTHER): Payer: Self-pay

## 2013-09-04 VITALS — BP 130/74 | HR 84 | Temp 97.7°F | Wt 220.5 lb

## 2013-09-04 DIAGNOSIS — R928 Other abnormal and inconclusive findings on diagnostic imaging of breast: Secondary | ICD-10-CM

## 2013-09-04 DIAGNOSIS — R350 Frequency of micturition: Secondary | ICD-10-CM

## 2013-09-04 DIAGNOSIS — M79602 Pain in left arm: Secondary | ICD-10-CM | POA: Insufficient documentation

## 2013-09-04 DIAGNOSIS — M79609 Pain in unspecified limb: Secondary | ICD-10-CM

## 2013-09-04 DIAGNOSIS — M79622 Pain in left upper arm: Secondary | ICD-10-CM

## 2013-09-04 LAB — POCT URINALYSIS DIPSTICK
Bilirubin, UA: NEGATIVE
Glucose, UA: NEGATIVE
Ketones, UA: NEGATIVE
NITRITE UA: NEGATIVE
PROTEIN UA: NEGATIVE
UROBILINOGEN UA: 0.2
pH, UA: 6

## 2013-09-04 MED ORDER — CIPROFLOXACIN HCL 500 MG PO TABS: 500.0000 mg | ORAL_TABLET | Freq: Two times a day (BID) | ORAL | Status: DC

## 2013-09-04 NOTE — Assessment & Plan Note (Signed)
UA pos for trace RBC and LE. Cipro 500 mg twice daily x 3 days. Send urine for cx.

## 2013-09-04 NOTE — Progress Notes (Signed)
Pre visit review using our clinic review tool, if applicable. No additional management support is needed unless otherwise documented below in the visit note. 

## 2013-09-04 NOTE — Progress Notes (Signed)
   Subjective:   Patient ID: Ariana Lopez, female    DOB: Nov 14, 1955, 58 y.o.   MRN: [id number]  Ariana Lopez is a pleasant 58 y.o. year old female who presents to clinic today with Arm Pain and Urinary Frequency  on 09/04/2013  HPI: 1.  Left arm pain- H/o left breast cancer ( diagnosed in 2002). S/p lumpectomy,sentinal node removal,  radiation and chemo.  Last mammogram 10/23/12.  Mole removed in 05/2013 on left breast.  Since mole removed, left axillary pain even when bra is off, sometimes has burning under upper arm and itching/swelling in left wrist.  2.  Urinary frequency- increased frequency for several weeks.  No dysuria.  No fevers, nausea or vomiting.  Current Outpatient Prescriptions on File Prior to Visit  Medication Sig Dispense Refill  . HYDROcodone-acetaminophen (NORCO/VICODIN) 5-325 MG per tablet Take 1 tablet by mouth every 6 (six) hours as needed for moderate pain.      Marland Kitchen triamcinolone cream (KENALOG) 0.1 % Apply 1 application topically 2 (two) times daily.      Marland Kitchen zolpidem (AMBIEN) 5 MG tablet Take 5 mg by mouth at bedtime as needed for sleep.       No current facility-administered medications on file prior to visit.    Allergies  Allergen Reactions  . Augmentin [Amoxicillin-Pot Clavulanate]     Insomnia/headache /nausea     Past Medical History  Diagnosis Date  . Breast cancer     Past Surgical History  Procedure Laterality Date  . Left breast lumpectomy Left 2002    Family History  Problem Relation Age of Onset  . Cancer Sister 66    breast    History   Social History  . Marital Status: Married    Spouse Name: N/A    Number of Children: N/A  . Years of Education: N/A   Occupational History  . Not on file.   Social History Main Topics  . Smoking status: Former Research scientist (life sciences)  . Smokeless tobacco: Never Used  . Alcohol Use: Yes     Comment: occ  . Drug Use: No  . Sexual Activity: Not on file   Other Topics Concern  . Not on file    Social History Narrative   G1P1   Married.   Recently moved here from [place].            The PMH, PSH, Social History, Family History, Medications, and allergies have been reviewed in [facility], and have been updated if relevant.   Review of Systems See HPI NO decreased grip strength No hematuria No nausea    Objective:    BP 130/74  Pulse 84  Temp(Src) 97.7 F (36.5 C) (Oral)  Wt 220 lb 8 oz (100.018 kg)  SpO2 95%   Physical Exam  Gen:  Alert pleasant, NAD Abd:  Soft, NT, No suprapubic or CVA tenderness Ext:  Left arm- normal to inspection other than some mild edema left medial wrist Normal grip strength TTP left axilla- no masses      Assessment & Plan:   Urinary frequency - Plan: Urinalysis Dipstick  Left arm pain No Follow-up on file.

## 2013-09-04 NOTE — Assessment & Plan Note (Signed)
New- axillary pain with h/o breast CA. ?brachial plexus radiculopathy with some lymphedema. mammo and ultrasound for further evaluation.

## 2013-09-04 NOTE — Patient Instructions (Addendum)
Good to see you. Have a good trip.  Please stop by to see Rosaria Ferries on your way out.  Thank you so much for the blanket.  Take cipro 500 mg twice daily x 3 days.  We will call you with your urine culture results.

## 2013-09-06 LAB — URINE CULTURE

## 2013-09-10 ENCOUNTER — Encounter: Payer: Self-pay | Admitting: Family Medicine

## 2013-09-10 ENCOUNTER — Ambulatory Visit: Payer: Self-pay | Admitting: Family Medicine

## 2013-09-12 ENCOUNTER — Encounter: Payer: Self-pay | Admitting: Family Medicine

## 2013-09-24 ENCOUNTER — Ambulatory Visit (INDEPENDENT_AMBULATORY_CARE_PROVIDER_SITE_OTHER): Payer: 59 | Admitting: Family Medicine

## 2013-09-24 ENCOUNTER — Encounter: Payer: Self-pay | Admitting: Family Medicine

## 2013-09-24 VITALS — BP 128/74 | HR 75 | Temp 97.6°F | Wt 223.8 lb

## 2013-09-24 DIAGNOSIS — R32 Unspecified urinary incontinence: Secondary | ICD-10-CM | POA: Insufficient documentation

## 2013-09-24 DIAGNOSIS — J029 Acute pharyngitis, unspecified: Secondary | ICD-10-CM

## 2013-09-24 DIAGNOSIS — J3089 Other allergic rhinitis: Secondary | ICD-10-CM

## 2013-09-24 DIAGNOSIS — N39490 Overflow incontinence: Secondary | ICD-10-CM

## 2013-09-24 DIAGNOSIS — R339 Retention of urine, unspecified: Secondary | ICD-10-CM

## 2013-09-24 DIAGNOSIS — R3914 Feeling of incomplete bladder emptying: Secondary | ICD-10-CM | POA: Insufficient documentation

## 2013-09-24 LAB — POCT URINALYSIS DIPSTICK
Bilirubin, UA: NEGATIVE
Glucose, UA: NEGATIVE
Ketones, UA: NEGATIVE
NITRITE UA: NEGATIVE
PH UA: 6
Protein, UA: NEGATIVE
Spec Grav, UA: >=1.03
UROBILINOGEN UA: 0.2

## 2013-09-24 NOTE — Progress Notes (Signed)
Pre visit review using our clinic review tool, if applicable. No additional management support is needed unless otherwise documented below in the visit note. 

## 2013-09-24 NOTE — Patient Instructions (Addendum)
Stop at front desk for referral to UROLOGY. Start back flonase 2 sprays per nostril daily.  Start nasal saline spray 2 sprays per nostril 3-4 times daily. Start zyrtec at bedtime. Use humidifier on trip. Follow up with PCP Dr. Deborra Medina in next 2 weeks if easy bruising and left axillae pain not resolving.

## 2013-09-24 NOTE — Assessment & Plan Note (Signed)
Restart flonase.  Add zyrtec.

## 2013-09-24 NOTE — Assessment & Plan Note (Signed)
No sign of UTI. Likely flow issue. Possible stricture. Refer to [place] for urodynamics.

## 2013-09-24 NOTE — Progress Notes (Signed)
Subjective:    Patient ID: Ariana Lopez, female    DOB: [birth year], 58 y.o.   MRN: [id number]  HPI  58 year old female presents with new onset sore throat after recent trip . She was exposed to dry air from air conditioner. Throat raspy. No post nasal drip.  Intermittant sneezing, itchy eyes. NO sinus pain, B ear itching, mild ear pain on left. Uses flonase but not daily.  She continues to have pain in left axillae, no mass, Had nml mammogram. H/o left breast cancer ( diagnosed in 2002).  S/p lumpectomy,sentinal node removal, radiation and chemo. Mole removed in 05/2013 on left breast. Since mole removed, left axillary pain even when bra is off, sometimes has burning under upper arm and itching/swelling in left wrist.  She has noted easy bruising. Daughter is concerned.  She was seen in past in difficulty emptying the bladder completely ongoing in last several months  Dribbling flow, slow stream then has to go back again soon.   No dysuria. Occ slight incontinence, wet panties at time. No urgency.  Saw PCP 6/10:  Same symptoms  UA pos for trace RBC and LE.  Cipro 500 mg twice daily x 3 days.  Urine Cx: negative. Continues to have same issues today.  Has seen uro in past.> 10 years ago. Had urodynamics many years ago.           Review of Systems  Constitutional: Negative for fever and fatigue.  HENT: Negative for ear pain.   Eyes: Negative for pain.  Respiratory: Negative for chest tightness and shortness of breath.   Cardiovascular: Negative for chest pain, palpitations and leg swelling.  Gastrointestinal: Negative for abdominal pain.  Genitourinary: Negative for dysuria.       Objective:   Physical Exam  Constitutional: Vital signs are normal. She appears well-developed and well-nourished. She is cooperative.  Non-toxic appearance. She does not appear ill. No distress.  HENT:  Head: Normocephalic.  Right Ear: Hearing, tympanic membrane, external ear and  ear canal normal. Tympanic membrane is not erythematous, not retracted and not bulging. No middle ear effusion.  Left Ear: Hearing, tympanic membrane, external ear and ear canal normal. Tympanic membrane is not erythematous, not retracted and not bulging.  No middle ear effusion.  Nose: Mucosal edema present. No rhinorrhea. Right sinus exhibits no maxillary sinus tenderness and no frontal sinus tenderness. Left sinus exhibits no maxillary sinus tenderness and no frontal sinus tenderness.  Mouth/Throat: Uvula is midline, oropharynx is clear and moist and mucous membranes are normal.  Eyes: Conjunctivae, EOM and lids are normal. Pupils are equal, round, and reactive to light. Lids are everted and swept, no foreign bodies found.  Neck: Trachea normal and normal range of motion. Neck supple. Carotid bruit is not present. No mass and no thyromegaly present.  Cardiovascular: Normal rate, regular rhythm, S1 normal, S2 normal, normal heart sounds, intact distal pulses and normal pulses.  Exam reveals no gallop and no friction rub.   No murmur heard. Pulmonary/Chest: Effort normal and breath sounds normal. Not tachypneic. No respiratory distress. She has no decreased breath sounds. She has no wheezes. She has no rhonchi. She has no rales.  Neurological: She is alert.  Skin: Skin is warm, dry and intact. No rash noted.  Psychiatric: Her speech is normal and behavior is normal. Judgment normal. Her mood appears not anxious. Cognition and memory are normal. She does not exhibit a depressed mood.  Assessment & Plan:

## 2013-09-24 NOTE — Assessment & Plan Note (Signed)
No sign of bacterial infection, likely due to dry air, allergies. Less likely viral infection.  Offered strep throat test. Recommended treating allergies as above.  Recommended humidifying air and starting nasal saline spray.  Pt was concerned about need for antibiotics. Pt reassured no sign of bacterial infection, but told if unilateral face pain or fever starts she can call back for consideration of antibiotics.  Discussed avoidance of antibiotics for nonbacterial infection to decrease bacterial resistance.

## 2013-11-04 ENCOUNTER — Encounter: Payer: Self-pay | Admitting: Internal Medicine

## 2013-11-04 ENCOUNTER — Ambulatory Visit (INDEPENDENT_AMBULATORY_CARE_PROVIDER_SITE_OTHER): Payer: 59 | Admitting: Internal Medicine

## 2013-11-04 VITALS — BP 120/70 | HR 73 | Temp 98.0°F | Wt 222.0 lb

## 2013-11-04 DIAGNOSIS — F4321 Adjustment disorder with depressed mood: Secondary | ICD-10-CM

## 2013-11-04 DIAGNOSIS — F411 Generalized anxiety disorder: Secondary | ICD-10-CM

## 2013-11-04 DIAGNOSIS — F419 Anxiety disorder, unspecified: Secondary | ICD-10-CM

## 2013-11-04 MED ORDER — DIAZEPAM 5 MG PO TABS: 5.0000 mg | ORAL_TABLET | Freq: Two times a day (BID) | ORAL | Status: DC | PRN

## 2013-11-04 NOTE — Patient Instructions (Addendum)
Grief Reaction Grief is a normal response to the death of someone close to you. Feelings of fear, anger, and guilt can affect almost everyone who loses someone they love. Symptoms of depression are also common. These include problems with sleep, loss of appetite, and lack of energy. These grief reaction symptoms often last for weeks to months after a loss. They may also return during special times that remind you of the person you lost, such as an anniversary or birthday. Anxiety, insomnia, irritability, and deep depression may last beyond the period of normal grief. If you experience these feelings for 6 months or longer, you may have clinical depression. Clinical depression requires further medical attention. If you think that you have clinical depression, you should contact your caregiver. If you have a history of depression or a family history of depression, you are at greater risk of clinical depression. You are also at greater risk of developing clinical depression if the loss was traumatic or the loss was of someone with whom you had unresolved issues.  A grief reaction can become complicated by being blocked. This means being unable to cry or express extreme emotions. This may prolong the grieving period and worsen the emotional effects of the loss. Mourning is a natural event in human life. A healthy grief reaction is one that is not blocked. It requires a time of sadness and readjustment. It is very important to share your sorrow and fear with others, especially close friends and family. Professional counselors and clergy can also help you process your grief. Document Released: 03/14/2005 Document Revised: 07/29/2013 Document Reviewed: 11/22/2005 [facility] Patient Information 2015 [place], [place]. This information is not intended to replace advice given to you by your health care provider. Make sure you discuss any questions you have with your health care provider.

## 2013-11-04 NOTE — Progress Notes (Signed)
Pre visit review using our clinic review tool, if applicable. No additional management support is needed unless otherwise documented below in the visit note. 

## 2013-11-04 NOTE — Progress Notes (Signed)
Subjective:    Patient ID: Ariana Lopez, female    DOB: 1955/08/24, 58 y.o.   MRN: [id number]  HPI  Pt presents to the clinic today with c/o anxiety. She reports this started yesterday. She did just have a family member pass away, her younger brother. Since that time, she reports she is fidgety and restless. She is unable to sleep. She cries at varying times. She does have a heaviness in her chest but denies chest pain or shortness of breath. She reports that she has never been treated for anxiety in the past.  Review of Systems      Past Medical History  Diagnosis Date  . Breast cancer     Current Outpatient Prescriptions  Medication Sig Dispense Refill  . HYDROcodone-acetaminophen (NORCO/VICODIN) 5-325 MG per tablet Take 1 tablet by mouth every 6 (six) hours as needed for moderate pain.      Marland Kitchen triamcinolone cream (KENALOG) 0.1 % Apply 1 application topically 2 (two) times daily.      Marland Kitchen zolpidem (AMBIEN) 5 MG tablet Take 5 mg by mouth at bedtime as needed for sleep.       No current facility-administered medications for this visit.    Allergies  Allergen Reactions  . Augmentin [Amoxicillin-Pot Clavulanate]     Insomnia/headache /nausea     Family History  Problem Relation Age of Onset  . Cancer Sister 83    breast    History   Social History  . Marital Status: Married    Spouse Name: N/A    Number of Children: N/A  . Years of Education: N/A   Occupational History  . Not on file.   Social History Main Topics  . Smoking status: Former Research scientist (life sciences)  . Smokeless tobacco: Never Used  . Alcohol Use: Yes     Comment: occ  . Drug Use: No  . Sexual Activity: Not on file   Other Topics Concern  . Not on file   Social History Narrative   G1P1   Married.   Recently moved here from [place].              Constitutional: Denies fever, malaise, fatigue, headache or abrupt weight changes.  Respiratory: Denies difficulty breathing, shortness of breath, cough or  sputum production.   Cardiovascular: Denies chest pain, chest tightness, palpitations or swelling in the hands or feet.  Psych: Pt reports anxiety. Denies depression, SI/HI.  No other specific complaints in a complete review of systems (except as listed in HPI above).  Objective:   Physical Exam  BP 120/70  Pulse 73  Temp(Src) 98 F (36.7 C) (Oral)  Wt 222 lb (100.699 kg)  SpO2 97% Wt Readings from Last 3 Encounters:  11/04/13 222 lb (100.699 kg)  09/24/13 223 lb 12 oz (101.492 kg)  09/04/13 220 lb 8 oz (100.018 kg)    General: Appears her stated age, well developed, well nourished in NAD.  Cardiovascular: Normal rate and rhythm. S1,S2 noted.  No murmur, rubs or gallops noted. No JVD or BLE edema. No carotid bruits noted. Pulmonary/Chest: Normal effort and positive vesicular breath sounds. No respiratory distress. No wheezes, rales or ronchi noted.  Psychiatric: Mood anxious and affect normal. Behavior is normal. Judgment and thought content normal.     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

## 2013-12-06 ENCOUNTER — Encounter: Payer: Self-pay | Admitting: Family Medicine

## 2013-12-06 ENCOUNTER — Ambulatory Visit (INDEPENDENT_AMBULATORY_CARE_PROVIDER_SITE_OTHER)
Admission: RE | Admit: 2013-12-06 | Discharge: 2013-12-06 | Disposition: A | Discharge status: Home / Self Care | Payer: 59 | Source: Ambulatory Visit | Attending: Family Medicine | Admitting: Family Medicine

## 2013-12-06 ENCOUNTER — Ambulatory Visit (INDEPENDENT_AMBULATORY_CARE_PROVIDER_SITE_OTHER): Payer: 59 | Admitting: Family Medicine

## 2013-12-06 VITALS — BP 118/70 | HR 80 | Temp 97.4°F | Wt 225.2 lb

## 2013-12-06 DIAGNOSIS — F4320 Adjustment disorder, unspecified: Secondary | ICD-10-CM

## 2013-12-06 DIAGNOSIS — L910 Hypertrophic scar: Secondary | ICD-10-CM

## 2013-12-06 DIAGNOSIS — F4321 Adjustment disorder with depressed mood: Secondary | ICD-10-CM | POA: Insufficient documentation

## 2013-12-06 DIAGNOSIS — M7989 Other specified soft tissue disorders: Secondary | ICD-10-CM

## 2013-12-06 DIAGNOSIS — Z23 Encounter for immunization: Secondary | ICD-10-CM

## 2013-12-06 MED ORDER — DIAZEPAM 5 MG PO TABS: 5.0000 mg | ORAL_TABLET | Freq: Two times a day (BID) | ORAL | Status: DC | PRN

## 2013-12-06 NOTE — Assessment & Plan Note (Signed)
New- ? OA. Will get xray given duration of symptoms. Not classic for gout.

## 2013-12-06 NOTE — Addendum Note (Signed)
Addended by: Modena Nunnery on: 12/06/2013 12:47 PM   Modules accepted: Orders

## 2013-12-06 NOTE — Assessment & Plan Note (Signed)
>  25 minutes spent in face to face time with patient, >50% spent in counselling or coordination of care Appropriate response.  Continue prn qhs valium for now. She is aware this is short term.

## 2013-12-06 NOTE — Assessment & Plan Note (Signed)
With persistent symptoms. Refer back to derm- ?benefit from injections into keloid. The patient indicates understanding of these issues and agrees with the plan.

## 2013-12-06 NOTE — Progress Notes (Signed)
Subjective:   Patient ID: Ariana Lopez, female    DOB: 1955/06/08, 58 y.o.   MRN: [id number]  Ariana Lopez is a pleasant 58 y.o. year old female who presents to clinic today with Keloid, Hand Pain and Depression  on 12/06/2013  HPI:  1.  Painful keloid- Mole removed in 05/2013 on left breast by Dr. Koleen Nimrod (derm).  Since mole removed, left axillary pain even when bra is off, sometimes has burning under upper arm and itching/swelling in left wrist. Mammogram/US neg 08/2013. Feels its just not getting better.  2.  Finger pain- over one month ago, right 3rd DIP started hurting.  Sometimes throbs.  No swelling or warmth.  At times, pain shoots down finger.  She knits regularly but no known trauma.  3.  Grief reaction- little brother died unexpectedly on December 01, 2013.  Saw Webb Silversmith for grief on 11/04/13- given rx for valium.  She has been taking this at bedtime.  Overall, she feels better.  Does ok during the day- no crying spells.  There is some family issues surrounding her death which has been compounding her grief, particularly at night.  Current Outpatient Prescriptions on File Prior to Visit  Medication Sig Dispense Refill  . HYDROcodone-acetaminophen (NORCO/VICODIN) 5-325 MG per tablet Take 1 tablet by mouth every 6 (six) hours as needed for moderate pain.      Marland Kitchen triamcinolone cream (KENALOG) 0.1 % Apply 1 application topically 2 (two) times daily.      Marland Kitchen zolpidem (AMBIEN) 5 MG tablet Take 5 mg by mouth at bedtime as needed for sleep.       No current facility-administered medications on file prior to visit.    Allergies  Allergen Reactions  . Augmentin [Amoxicillin-Pot Clavulanate]     Insomnia/headache /nausea     Past Medical History  Diagnosis Date  . Breast cancer     Past Surgical History  Procedure Laterality Date  . Left breast lumpectomy Left 2002    Family History  Problem Relation Age of Onset  . Cancer Sister 75    breast    History   Social  History  . Marital Status: Married    Spouse Name: N/A    Number of Children: N/A  . Years of Education: N/A   Occupational History  . Not on file.   Social History Main Topics  . Smoking status: Former Research scientist (life sciences)  . Smokeless tobacco: Never Used  . Alcohol Use: Yes     Comment: occ  . Drug Use: No  . Sexual Activity: Not on file   Other Topics Concern  . Not on file   Social History Narrative   G1P1   Married.   Recently moved here from [place].            The PMH, PSH, Social History, Family History, Medications, and allergies have been reviewed in [facility], and have been updated if relevant.    Review of Systems See HPI No CP No SOB No joint swelling No fevers No night sweats No SI or HI Appetite good- weight stable Wt Readings from Last 3 Encounters:  12/06/13 225 lb 4 oz (102.173 kg)  11/04/13 222 lb (100.699 kg)  09/24/13 223 lb 12 oz (101.492 kg)       Objective:    BP 118/70  Pulse 80  Temp(Src) 97.4 F (36.3 C) (Oral)  Wt 225 lb 4 oz (102.173 kg)  SpO2 97%   Physical Exam  Constitutional: She appears well-developed and  well-nourished. No distress.  HENT:  Head: Normocephalic.  Musculoskeletal:       Hands: Skin: Skin is warm and dry.  Raised keloid lateral to left breast, TTP  Psychiatric: She has a normal mood and affect. Her behavior is normal. Judgment and thought content normal.          Assessment & Plan:   Swelling of finger, right - Plan: DG Hand Complete Right  Grief reaction  Keloid scar No Follow-up on file.

## 2013-12-06 NOTE — Progress Notes (Signed)
Pre visit review using our clinic review tool, if applicable. No additional management support is needed unless otherwise documented below in the visit note. 

## 2013-12-06 NOTE — Patient Instructions (Signed)
Great to see you. I am so sorry for your loss.  Check with your insurance to see if they will cover the shingles shot.

## 2014-01-03 ENCOUNTER — Ambulatory Visit (INDEPENDENT_AMBULATORY_CARE_PROVIDER_SITE_OTHER): Payer: 59

## 2014-01-03 DIAGNOSIS — Z23 Encounter for immunization: Secondary | ICD-10-CM

## 2014-01-06 ENCOUNTER — Encounter: Payer: Self-pay | Admitting: Family Medicine

## 2014-01-06 ENCOUNTER — Telehealth: Payer: Self-pay | Admitting: Family Medicine

## 2014-01-06 ENCOUNTER — Ambulatory Visit (INDEPENDENT_AMBULATORY_CARE_PROVIDER_SITE_OTHER): Payer: 59 | Admitting: Family Medicine

## 2014-01-06 VITALS — HR 76 | Temp 98.2°F | Resp 14 | Wt 222.5 lb

## 2014-01-06 DIAGNOSIS — R238 Other skin changes: Secondary | ICD-10-CM | POA: Insufficient documentation

## 2014-01-06 DIAGNOSIS — L988 Other specified disorders of the skin and subcutaneous tissue: Secondary | ICD-10-CM

## 2014-01-06 MED ORDER — CETIRIZINE HCL 10 MG PO TABS: 10.0000 mg | ORAL_TABLET | Freq: Every day | ORAL | Status: DC

## 2014-01-06 MED ORDER — ALBUTEROL SULFATE HFA 108 (90 BASE) MCG/ACT IN AERS: 2.0000 | INHALATION_SPRAY | Freq: Four times a day (QID) | RESPIRATORY_TRACT | Status: DC | PRN

## 2014-01-06 MED ORDER — FAMOTIDINE 20 MG PO TABS: 20.0000 mg | ORAL_TABLET | Freq: Every day | ORAL | Status: DC

## 2014-01-06 NOTE — Telephone Encounter (Signed)
Patient Information:  Caller Name: [name]  Phone: [phone]  Patient: Ariana Lopez, Ariana Lopez  Gender: Female  DOB: 02/15/1956  Age: 58 Years  PCP: Arnette Norris [place]  Office Follow Up:  Does the office need to follow up with this patient?: No  Instructions For The Office: N/A  RN Note:  Has appoointment for 4:15 pm today.  Symptoms  Reason For Call & Symptoms: In office on Friday 10/9 about 11 am - was given Shingles Vaccine and seemed okay that day.  On Saturday am 10/10 noted itching at injection site and found huge baseball size redness and swelling and by evening having chills.  Sunday  10/11 chills gone but feeling hot and cold all day but did not feel like fever. Slept most of day.  By evening hard lump size half-dollar to dollar size at injection site.  Now has bubbles on top of swollen area.hard  and is hard around the edges.  Can't stand for anything including clothing to touch area.  Reviewed Health History In EMR: Yes  Reviewed Medications In EMR: Yes  Reviewed Allergies In EMR: Yes  Reviewed Surgeries / Procedures: Yes  Date of Onset of Symptoms: 12/27/2013  Guideline(s) Used:  Immunization Reactions  Disposition Per Guideline:   See Today or Tomorrow in Office  Reason For Disposition Reached:   Patient wants to be seen  Advice Given:  N/A  Patient Will Follow Care Advice:  YES

## 2014-01-06 NOTE — Patient Instructions (Signed)
Pepcid and zyrtec for itching.  Use the inhaler if needed.  Keep the area clean.  When the blister pops, leave the roof intact.  Cover with a nonstick bandage at that point.  Call back as needed. Take care.

## 2014-01-06 NOTE — Assessment & Plan Note (Signed)
Likely a overly vigorous reacation to zostavax, not a true allergy, with local inflammation, local bullous lesion and subsequent proximal LA in the R axilla.  Doesn't look infected.  With the itching present, would use pepcid and zyrtec, use SABA only prn.  BP not checked with h/o L breast cancer.  F/u prn. I didn't list this vaccine as an allergy- routed to PCP as FYI.

## 2014-01-06 NOTE — Progress Notes (Signed)
Pre visit review using our clinic review tool, if applicable. No additional management support is needed unless otherwise documented below in the visit note.  Had a shingles shot Friday here at the clinic, R upper arm.  Saturday AM she had itching and swelling locally.  Then had chills until 4AM Sunday AM.  Swelling went down but the area got hard.  Then had a bullous lesion in the area.  No other pain anywhere other than the R arm.  Itches locally more than hurts, laterally not but medially.  R axilla is sore.  No lip or tongue swelling. She feels slightly SOB going up stairs, but she has been stuffy.  She has wheezed some.  She has a h/o some wheezing, but not usually.    Meds, vitals, and allergies reviewed.   ROS: See HPI.  Otherwise, noncontributory.  nad ncat Mmm OP wnl, no lip or oral edema Neck supple, no LA, no stridor rrr ctab no wheeze Ext well perfused.  RUE with 16x14 patch of tender itchy red blanching skin on lateral upper arm.  Centrally with raised ares surrounding a 3cm bullous lesion, intact, clear fluid R axilla with tender LA

## 2014-01-06 NOTE — Telephone Encounter (Signed)
Will see today. Thanks

## 2014-03-11 ENCOUNTER — Encounter: Payer: Self-pay | Admitting: Family Medicine

## 2014-03-11 ENCOUNTER — Ambulatory Visit (INDEPENDENT_AMBULATORY_CARE_PROVIDER_SITE_OTHER)
Admission: RE | Admit: 2014-03-11 | Discharge: 2014-03-11 | Disposition: A | Discharge status: Home / Self Care | Payer: 59 | Source: Ambulatory Visit | Attending: Family Medicine | Admitting: Family Medicine

## 2014-03-11 ENCOUNTER — Ambulatory Visit (INDEPENDENT_AMBULATORY_CARE_PROVIDER_SITE_OTHER): Payer: 59 | Admitting: Family Medicine

## 2014-03-11 VITALS — BP 130/72 | HR 86 | Temp 97.7°F | Wt 228.5 lb

## 2014-03-11 DIAGNOSIS — M25562 Pain in left knee: Secondary | ICD-10-CM | POA: Insufficient documentation

## 2014-03-11 MED ORDER — FLUTICASONE PROPIONATE 50 MCG/ACT NA SUSP: 2.0000 | Freq: Every day | NASAL | Status: DC

## 2014-03-11 NOTE — Progress Notes (Signed)
Subjective:   Patient ID: Ariana Lopez, female    DOB: [birth year], 58 y.o.   MRN: [id number]  Ariana Lopez is a pleasant 58 y.o. year old female who presents to clinic today with Knee Pain  on 03/11/2014  HPI: Tripped over a suit case in her room in the middle of the night.  Hard fall onto her left knee.  Bruised immediately but no swelling.  No pain with walking but there is one spot on her knee cap that is very tender to palpation.  Still not having any swelling.  Current Outpatient Prescriptions on File Prior to Visit  Medication Sig Dispense Refill  . cetirizine (ZYRTEC) 10 MG tablet Take 1 tablet (10 mg total) by mouth daily.    . diazepam (VALIUM) 5 MG tablet Take 1 tablet (5 mg total) by mouth every 12 (twelve) hours as needed for anxiety. 30 tablet 0  . famotidine (PEPCID) 20 MG tablet Take 1 tablet (20 mg total) by mouth daily.    Marland Kitchen HYDROcodone-acetaminophen (NORCO/VICODIN) 5-325 MG per tablet Take 1 tablet by mouth every 6 (six) hours as needed for moderate pain.    Marland Kitchen triamcinolone cream (KENALOG) 0.1 % Apply 1 application topically 2 (two) times daily.    Marland Kitchen zolpidem (AMBIEN) 5 MG tablet Take 5 mg by mouth at bedtime as needed for sleep.     No current facility-administered medications on file prior to visit.    Allergies  Allergen Reactions  . Augmentin [Amoxicillin-Pot Clavulanate]     Insomnia/headache /nausea     Past Medical History  Diagnosis Date  . Breast cancer     Past Surgical History  Procedure Laterality Date  . Left breast lumpectomy Left 2002    Family History  Problem Relation Age of Onset  . Cancer Sister 11    breast    History   Social History  . Marital Status: Married    Spouse Name: N/A    Number of Children: N/A  . Years of Education: N/A   Occupational History  . Not on file.   Social History Main Topics  . Smoking status: Former Research scientist (life sciences)  . Smokeless tobacco: Never Used  . Alcohol Use: Yes     Comment: occ  . Drug  Use: No  . Sexual Activity: Not on file   Other Topics Concern  . Not on file   Social History Narrative   G1P1   Married.   Recently moved here from [place].            The PMH, PSH, Social History, Family History, Medications, and allergies have been reviewed in [facility], and have been updated if relevant.   Review of Systems  Musculoskeletal: Negative for joint swelling and gait problem.  Skin: Negative.   Neurological: Negative.   Psychiatric/Behavioral: Negative.   All other systems reviewed and are negative.      Objective:    BP 130/72 mmHg  Pulse 86  Temp(Src) 97.7 F (36.5 C) (Oral)  Wt 228 lb 8 oz (103.647 kg)  SpO2 94%   Physical Exam  Constitutional: She is oriented to person, place, and time. She appears well-developed and well-nourished. No distress.  HENT:  Head: Normocephalic and atraumatic.  Cardiovascular: Normal rate.   Pulmonary/Chest: Effort normal.  Musculoskeletal:       Left knee: She exhibits abnormal patellar mobility. She exhibits normal range of motion, no swelling, no effusion, no ecchymosis, no deformity, no laceration, no erythema, normal alignment, no LCL  laxity, no bony tenderness and normal meniscus. Tenderness found. No medial joint line, no lateral joint line, no MCL, no LCL and no patellar tendon tenderness noted.       Legs: Neurological: She is alert and oriented to person, place, and time.  Skin: Skin is warm and dry.  Psychiatric: She has a normal mood and affect. Her behavior is normal. Thought content normal.  Nursing note and vitals reviewed.         Assessment & Plan:   Knee pain, left - Plan: DG Knee Complete 4 Views Left No Follow-up on file.

## 2014-03-11 NOTE — Progress Notes (Signed)
Pre visit review using our clinic review tool, if applicable. No additional management support is needed unless otherwise documented below in the visit note. 

## 2014-03-11 NOTE — Patient Instructions (Signed)
Great to see you. Happy Holidays.  I will call you with your xray results.

## 2014-03-11 NOTE — Assessment & Plan Note (Signed)
New- reassuring exam but is very tender over one small area- xray today to rule out patellar fracture or other acute findings. Call or return to clinic prn if these symptoms worsen or fail to improve as anticipated. The patient indicates understanding of these issues and agrees with the plan.

## 2014-06-12 ENCOUNTER — Encounter: Payer: Self-pay | Admitting: Family Medicine

## 2014-06-12 ENCOUNTER — Ambulatory Visit (INDEPENDENT_AMBULATORY_CARE_PROVIDER_SITE_OTHER): Payer: 59 | Admitting: Family Medicine

## 2014-06-12 VITALS — BP 122/78 | HR 78 | Temp 97.4°F | Ht 67.0 in | Wt 228.8 lb

## 2014-06-12 DIAGNOSIS — M5410 Radiculopathy, site unspecified: Secondary | ICD-10-CM

## 2014-06-12 DIAGNOSIS — G8929 Other chronic pain: Secondary | ICD-10-CM

## 2014-06-12 DIAGNOSIS — M5416 Radiculopathy, lumbar region: Principal | ICD-10-CM

## 2014-06-12 DIAGNOSIS — J018 Other acute sinusitis: Secondary | ICD-10-CM

## 2014-06-12 MED ORDER — AZITHROMYCIN 250 MG PO TABS: ORAL_TABLET | ORAL | Status: DC

## 2014-06-12 MED ORDER — TRAMADOL HCL 50 MG PO TABS: 50.0000 mg | ORAL_TABLET | Freq: Four times a day (QID) | ORAL | Status: DC | PRN

## 2014-06-12 MED ORDER — PREDNISONE 20 MG PO TABS: ORAL_TABLET | ORAL | Status: DC

## 2014-06-12 NOTE — Progress Notes (Signed)
Pre visit review using our clinic review tool, if applicable. No additional management support is needed unless otherwise documented below in the visit note. 

## 2014-06-12 NOTE — Progress Notes (Signed)
Dr. Frederico Hamman T. Doy Taaffe, MD, [facility] [place], [address] Phone: [phone] Fax: [phone]  06/12/2014  Patient: Ariana Lopez, MRN: [id number], DOB: September 13, 1955, 59 y.o.  Primary Physician:  Arnette Norris, MD  Chief Complaint: Back Pain  Subjective:   Ariana Lopez is a 59 y.o. very pleasant female patient who presents with the following:  Pleasant patient who presents with several issues.  Over the last 7-10 days she's been having worsening pain in her sinuses and some worsening ear pain.  She is having significant pain on the left side.  This is mostly just anterior to the ear.  She's also had some URI symptoms and some coughing previous to this.  She has also had intermittent back pain off and on for a number of years.  Currently this is flared up and she is having some pain going down her left leg.  She is having some difficulty getting up and moving around.  She is not having any palpable bladder incontinence that is new or different compared to her baseline.  She has not had any prior operative intervention.  L radicular pain, also thinks has an earache and has been sick.  1st and 3rd dorsal finger pain. Some 1st numbness.   L side.  Past Medical History, Surgical History, Social History, Family History, Problem List, Medications, and Allergies have been reviewed and updated if relevant.  GEN: no acute illness or fever CV: No chest pain or shortness of breath MSK: detailed above Neuro: neurological signs are described above ROS O/w per HPI  Objective:   BP 122/78 mmHg  Pulse 78  Temp(Src) 97.4 F (36.3 C) (Oral)  Ht 5\' 7"  (1.702 m)  Wt 228 lb 12.8 oz (103.783 kg)  BMI 35.83 kg/m2  SpO2 90%   Gen: WDWN, NAD; alert,appropriate and cooperative throughout exam  HEENT: Normocephalic and atraumatic. Throat clear, w/o exudate, no LAD, R TM clear, L TM - good landmarks, No fluid present. rhinnorhea.    Left frontal and maxillary sinuses: Tender, max Right frontal and maxillary sinuses: nonTender  Neck: No ant or post LAD CV: RRR, No M/G/R Pulm: Breathing comfortably in no resp distress. no w/c/r Abd: S,NT,ND,+BS Extr: no c/c/e Psych: full affect, pleasant   Range of motion at  the waist: Flexion, extension, lateral bending and rotation: flex and ext modestly impaired  No echymosis or edema Rises to examination table with mild difficulty Gait: minimally antalgic  Inspection/Deformity: N Paraspinus Tenderness: l2-s1  B Ankle Dorsiflexion (L5,4): 5/5 B Great Toe Dorsiflexion (L5,4): 5/5 Heel Walk (L5): WNL Toe Walk (S1): WNL Rise/Squat (L4): WNL, mild pain  SENSORY B Medial Foot (L4): WNL B Dorsum (L5): WNL B Lateral (S1): WNL Light Touch: WNL Pinprick: WNL  REFLEXES Knee (L4): 1+ Ankle (S1): 1+  B SLR, seated: + L B SLR, supine: + L B FABER: neg B Reverse FABER: neg B Greater Troch: NT B Log Roll: neg B Stork: NT B Sciatic Notch: NT    Assessment and Plan:   Chronic radicular low back pain  Other acute sinusitis  Acute on chronic low back pain.  The patient has had this intermittently with intermittent flareups in the past.  I'm going to give her a pulse doses of steroids then taper.  If she continues to have difficulty and we can add some neuropathic pain agent.  Acute sinusitis: ABX as below.   Reviewed symptomatic care as well as ABX in  this case.    Follow-up: No Follow-up on file.  New Prescriptions   AZITHROMYCIN (ZITHROMAX) 250 MG TABLET    2 tabs po on day 1, then 1 tab po for 4 days   PREDNISONE (DELTASONE) 20 MG TABLET    2 tabs po for 4 days, then 1 po daily   No orders of the defined types were placed in this encounter.    Signed,  Maud Deed. Diana Davenport, MD   Patient's Medications  New Prescriptions   AZITHROMYCIN (ZITHROMAX) 250 MG TABLET    2 tabs po on day 1, then 1 tab po for 4 days   PREDNISONE (DELTASONE) 20 MG TABLET    2  tabs po for 4 days, then 1 po daily  Previous Medications   FLUTICASONE (FLONASE) 50 MCG/ACT NASAL SPRAY    Place 2 sprays into both nostrils daily.  Modified Medications   Modified Medication Previous Medication   TRAMADOL (ULTRAM) 50 MG TABLET traMADol (ULTRAM) 50 MG tablet      Take 1 tablet (50 mg total) by mouth every 6 (six) hours as needed.    Take 50 mg by mouth as needed.  Discontinued Medications   CETIRIZINE (ZYRTEC) 10 MG TABLET    Take 1 tablet (10 mg total) by mouth daily.   DIAZEPAM (VALIUM) 5 MG TABLET    Take 1 tablet (5 mg total) by mouth every 12 (twelve) hours as needed for anxiety.   FAMOTIDINE (PEPCID) 20 MG TABLET    Take 1 tablet (20 mg total) by mouth daily.   HYDROCODONE-ACETAMINOPHEN (NORCO/VICODIN) 5-325 MG PER TABLET    Take 1 tablet by mouth every 6 (six) hours as needed for moderate pain.   TRIAMCINOLONE CREAM (KENALOG) 0.1 %    Apply 1 application topically 2 (two) times daily.   ZOLPIDEM (AMBIEN) 5 MG TABLET    Take 5 mg by mouth at bedtime as needed for sleep.

## 2014-06-25 ENCOUNTER — Ambulatory Visit (INDEPENDENT_AMBULATORY_CARE_PROVIDER_SITE_OTHER)
Admission: RE | Admit: 2014-06-25 | Discharge: 2014-06-25 | Disposition: A | Discharge status: Home / Self Care | Payer: 59 | Source: Ambulatory Visit | Attending: Family Medicine | Admitting: Family Medicine

## 2014-06-25 ENCOUNTER — Encounter: Payer: Self-pay | Admitting: Family Medicine

## 2014-06-25 ENCOUNTER — Other Ambulatory Visit: Payer: Self-pay | Admitting: Family Medicine

## 2014-06-25 ENCOUNTER — Ambulatory Visit (INDEPENDENT_AMBULATORY_CARE_PROVIDER_SITE_OTHER): Payer: 59 | Admitting: Family Medicine

## 2014-06-25 VITALS — BP 118/80 | HR 78 | Temp 97.7°F | Wt 229.0 lb

## 2014-06-25 DIAGNOSIS — M5441 Lumbago with sciatica, right side: Secondary | ICD-10-CM | POA: Diagnosis not present

## 2014-06-25 DIAGNOSIS — M7989 Other specified soft tissue disorders: Secondary | ICD-10-CM | POA: Insufficient documentation

## 2014-06-25 DIAGNOSIS — M545 Low back pain, unspecified: Secondary | ICD-10-CM | POA: Insufficient documentation

## 2014-06-25 DIAGNOSIS — J3089 Other allergic rhinitis: Secondary | ICD-10-CM | POA: Diagnosis not present

## 2014-06-25 DIAGNOSIS — G4700 Insomnia, unspecified: Secondary | ICD-10-CM | POA: Diagnosis not present

## 2014-06-25 LAB — URIC ACID: Uric Acid, Serum: 5 mg/dL (ref 2.4–7.0)

## 2014-06-25 NOTE — Progress Notes (Signed)
Pre visit review using our clinic review tool, if applicable. No additional management support is needed unless otherwise documented below in the visit note. 

## 2014-06-25 NOTE — Assessment & Plan Note (Signed)
Persistent but she feels it has now moved to a more lateral location. I still feel this is likely the same process.  She is refusing further steroids or nerve rx like gabapentin, lyrica or elavil. Xray today Add NSAID- see AVS.

## 2014-06-25 NOTE — Assessment & Plan Note (Signed)
New- reassuring that she has FROM and no known injury. Will check uric acid today to rule out gout and proceed with further work up if symptoms persist. May also respond to NSAID. See below.

## 2014-06-25 NOTE — Assessment & Plan Note (Signed)
Deteriorated. Discussed treatment options- she will try adding claritin or other antihistamine to her nasal steroid. Call or return to clinic prn if these symptoms worsen or fail to improve as anticipated. The patient indicates understanding of these issues and agrees with the plan.

## 2014-06-25 NOTE — Progress Notes (Signed)
Subjective:   Patient ID: Ariana Lopez, female    DOB: [birth year], 59 y.o.   MRN: [id number]  Ariana Lopez is a pleasant 59 y.o. year old female who presents to clinic today with Back Pain  on 06/25/2014  HPI:  Saw Dr. Lorelei Pont on 06/12/14 (last week) for back pain.  Note reviewed.  Given prednisone and tramadol- back pain has resolved but now having pain on her right "side."- she points to her low back but more lateral than medial.   Feels it when she lifts her right leg. Chronic recurrent issues/flares of these similar symptoms. Gabapentin and Lyrica have caused nausea in past.  Allergic rhinitis- deteriorated.  Treated with zpack last week.  Still having nasal congestion and scratchy throat.  Using flonase, no antihistamines.  Right 3rd finger swelling- intermittent and noticed it a few days ago.  No known injury.  Does not have h/o gout. No warmth.  Current Outpatient Prescriptions on File Prior to Visit  Medication Sig Dispense Refill  . fluticasone (FLONASE) 50 MCG/ACT nasal spray Place 2 sprays into both nostrils daily. 16 g 6  . traMADol (ULTRAM) 50 MG tablet Take 1 tablet (50 mg total) by mouth every 6 (six) hours as needed. 30 tablet 1   No current facility-administered medications on file prior to visit.    Allergies  Allergen Reactions  . Augmentin [Amoxicillin-Pot Clavulanate]     Insomnia/headache /nausea. Able to take PCN and Amox.  . Gabapentin Nausea Only    Past Medical History  Diagnosis Date  . Breast cancer     Past Surgical History  Procedure Laterality Date  . Left breast lumpectomy Left 2002    Family History  Problem Relation Age of Onset  . Cancer Sister 53    breast    History   Social History  . Marital Status: Married    Spouse Name: N/A  . Number of Children: N/A  . Years of Education: N/A   Occupational History  . Not on file.   Social History Main Topics  . Smoking status: Former Research scientist (life sciences)  . Smokeless tobacco: Never  Used  . Alcohol Use: Yes     Comment: occ  . Drug Use: No  . Sexual Activity: Not on file   Other Topics Concern  . Not on file   Social History Narrative   G1P1   Married.   Recently moved here from [place].            The PMH, PSH, Social History, Family History, Medications, and allergies have been reviewed in [facility], and have been updated if relevant.  Review of Systems     Objective:    BP 118/80 mmHg  Pulse 78  Temp(Src) 97.7 F (36.5 C) (Oral)  Wt 229 lb (103.874 kg)  SpO2 93%   Physical Exam  Constitutional: She is oriented to person, place, and time. She appears well-developed and well-nourished. No distress.  HENT:  Head: Normocephalic.  Eyes: Conjunctivae are normal.  Neck: Neck supple.  Cardiovascular: Normal rate.   Pulmonary/Chest: Effort normal.  Musculoskeletal:       Lumbar back: She exhibits pain and spasm. She exhibits normal range of motion, no tenderness, no bony tenderness and no swelling.  +SLR right, neg fabers bilaterally Pain is elicited when she stands from seated position  Right hand- 3rd finger- obvious swelling of PIP, able to extend and flex finger fully, no erythema, ? warmth  Neurological: She is alert and oriented to person,  place, and time. No cranial nerve deficit.  Skin: Skin is warm and dry.  Psychiatric: She has a normal mood and affect. Her behavior is normal. Judgment and thought content normal.  Nursing note and vitals reviewed.         Assessment & Plan:   Right-sided low back pain with right-sided sciatica - Plan: DG Lumbar Spine Complete  Insomnia - Plan: CANCELED: DG Lumbar Spine Complete  Finger swelling - Plan: Uric Acid No Follow-up on file.

## 2014-06-25 NOTE — Addendum Note (Signed)
Addended by: Ellamae Sia on: 06/25/2014 11:46 AM   Modules accepted: Orders

## 2014-06-25 NOTE — Patient Instructions (Addendum)
It was good to see you.  Please stop by the lab first.  Continue with as needed tramadol and Alleve with food. Stop by to see Rosaria Ferries to set up you back xray.  Try Claritin for your allergies.

## 2014-11-07 ENCOUNTER — Other Ambulatory Visit: Payer: Self-pay | Admitting: Orthopedic Surgery

## 2014-11-07 DIAGNOSIS — M542 Cervicalgia: Secondary | ICD-10-CM

## 2014-11-19 ENCOUNTER — Ambulatory Visit
Admission: RE | Admit: 2014-11-19 | Discharge: 2014-11-19 | Disposition: A | Discharge status: Home / Self Care | Payer: BLUE CROSS/BLUE SHIELD | Source: Ambulatory Visit | Attending: Orthopedic Surgery | Admitting: Orthopedic Surgery

## 2014-11-19 DIAGNOSIS — M4802 Spinal stenosis, cervical region: Secondary | ICD-10-CM | POA: Insufficient documentation

## 2014-11-19 DIAGNOSIS — M542 Cervicalgia: Secondary | ICD-10-CM

## 2014-11-19 DIAGNOSIS — M5021 Other cervical disc displacement,  high cervical region: Secondary | ICD-10-CM | POA: Insufficient documentation

## 2015-01-13 ENCOUNTER — Ambulatory Visit (INDEPENDENT_AMBULATORY_CARE_PROVIDER_SITE_OTHER): Payer: 59 | Admitting: Family Medicine

## 2015-01-13 ENCOUNTER — Encounter: Payer: Self-pay | Admitting: Family Medicine

## 2015-01-13 VITALS — BP 122/64 | HR 62 | Temp 98.1°F | Wt 223.0 lb

## 2015-01-13 DIAGNOSIS — M533 Sacrococcygeal disorders, not elsewhere classified: Secondary | ICD-10-CM | POA: Diagnosis not present

## 2015-01-13 NOTE — Progress Notes (Signed)
Subjective:   Patient ID: Ariana Lopez, female    DOB: 05-20-1955, 59 y.o.   MRN: [id number]  Ariana Lopez is a pleasant 59 y.o. year old year old female who presents to clinic today with Tailbone Pain  on 01/13/2015  HPI:  Intermittent tail bone pain for two weeks.  Only hurts when seated. No known trauma.  Has been sitting more at work.  Pain does not radiate.  No abdominal pain.  No changes in her bowel habits or blood in her stool.  Does have h/o OA and chronic back pain- followed by ortho.  Current Outpatient Prescriptions on File Prior to Visit  Medication Sig Dispense Refill  . fluticasone (FLONASE) 50 MCG/ACT nasal spray Place 2 sprays into both nostrils daily. 16 g 6  . traMADol (ULTRAM) 50 MG tablet Take 1 tablet (50 mg total) by mouth every 6 (six) hours as needed. (Patient not taking: Reported on 01/13/2015) 30 tablet 1   No current facility-administered medications on file prior to visit.    Allergies  Allergen Reactions  . Augmentin [Amoxicillin-Pot Clavulanate]     Insomnia/headache /nausea. Able to take PCN and Amox.  . Gabapentin Nausea Only    Past Medical History  Diagnosis Date  . Breast cancer [facility])     Past Surgical History  Procedure Laterality Date  . Left breast lumpectomy Left 2002    Family History  Problem Relation Age of Onset  . Cancer Sister 98    breast    Social History   Social History  . Marital Status: Married    Spouse Name: N/A  . Number of Children: N/A  . Years of Education: N/A   Occupational History  . Not on file.   Social History Main Topics  . Smoking status: Former Research scientist (life sciences)  . Smokeless tobacco: Never Used  . Alcohol Use: Yes     Comment: occ  . Drug Use: No  . Sexual Activity: Not on file   Other Topics Concern  . Not on file   Social History Narrative   G1P1   Married.   Recently moved here from [place].            The PMH, PSH, Social History, Family History, Medications, and allergies have been  reviewed in [facility], and have been updated if relevant.   Review of Systems  Constitutional: Negative.   HENT: Negative.   Cardiovascular: Negative.   Gastrointestinal: Negative.   Endocrine: Negative.   Genitourinary: Negative.   Musculoskeletal: Positive for back pain. Negative for gait problem.  Skin: Negative.   Allergic/Immunologic: Negative.   Neurological: Negative.   Hematological: Negative.   Psychiatric/Behavioral: Negative.   All other systems reviewed and are negative.      Objective:    BP 122/64 mmHg  Pulse 62  Temp(Src) 98.1 F (36.7 C) (Oral)  Wt 223 lb (101.152 kg)  SpO2 98%   Physical Exam  Constitutional: She is oriented to person, place, and time. She appears well-developed and well-nourished. No distress.  HENT:  Head: Normocephalic.  Eyes: Conjunctivae are normal.  Cardiovascular: Normal rate.   Pulmonary/Chest: Effort normal.  Musculoskeletal:       Back:  Neurological: She is alert and oriented to person, place, and time. No cranial nerve deficit.  Skin: Skin is warm and dry. She is not diaphoretic.  Psychiatric: She has a normal mood and affect. Her behavior is normal. Thought content normal.  Nursing note and vitals reviewed.  Assessment & Plan:   No diagnosis found. No Follow-up on file.

## 2015-01-13 NOTE — Assessment & Plan Note (Signed)
No known trauma. Probable OA/strain. Advised getting cushion for her seat at work, getting up to walk more. Call or return to clinic prn if these symptoms worsen or fail to improve as anticipated. The patient indicates understanding of these issues and agrees with the plan.

## 2015-01-13 NOTE — Progress Notes (Signed)
Pre visit review using our clinic review tool, if applicable. No additional management support is needed unless otherwise documented below in the visit note. 

## 2015-08-03 ENCOUNTER — Other Ambulatory Visit: Payer: Self-pay | Admitting: Family Medicine

## 2015-10-09 ENCOUNTER — Other Ambulatory Visit: Payer: Self-pay | Admitting: Otolaryngology

## 2015-10-09 DIAGNOSIS — R519 Headache, unspecified: Secondary | ICD-10-CM

## 2015-10-09 DIAGNOSIS — R51 Headache: Secondary | ICD-10-CM

## 2015-10-09 DIAGNOSIS — H9201 Otalgia, right ear: Secondary | ICD-10-CM

## 2015-10-23 ENCOUNTER — Ambulatory Visit
Admission: RE | Admit: 2015-10-23 | Discharge: 2015-10-23 | Disposition: A | Discharge status: Home / Self Care | Payer: BLUE CROSS/BLUE SHIELD | Source: Ambulatory Visit | Attending: Otolaryngology | Admitting: Otolaryngology

## 2015-10-23 DIAGNOSIS — I669 Occlusion and stenosis of unspecified cerebral artery: Secondary | ICD-10-CM | POA: Insufficient documentation

## 2015-10-23 DIAGNOSIS — R51 Headache: Secondary | ICD-10-CM

## 2015-10-23 DIAGNOSIS — H9201 Otalgia, right ear: Secondary | ICD-10-CM

## 2015-10-23 DIAGNOSIS — R519 Headache, unspecified: Secondary | ICD-10-CM

## 2015-10-23 LAB — POCT I-STAT CREATININE: Creatinine, Ser: 0.8 mg/dL (ref 0.44–1.00)

## 2015-10-23 MED ORDER — GADOBENATE DIMEGLUMINE 529 MG/ML IV SOLN
20.0000 mL | Freq: Once | INTRAVENOUS | Status: AC | PRN
  Administered 2015-10-23: 20 mL via INTRAVENOUS

## 2015-12-22 ENCOUNTER — Other Ambulatory Visit: Payer: Self-pay | Admitting: Neurology

## 2015-12-22 DIAGNOSIS — H93A1 Pulsatile tinnitus, right ear: Secondary | ICD-10-CM

## 2015-12-29 ENCOUNTER — Ambulatory Visit (INDEPENDENT_AMBULATORY_CARE_PROVIDER_SITE_OTHER): Payer: 59

## 2015-12-29 DIAGNOSIS — Z23 Encounter for immunization: Secondary | ICD-10-CM

## 2015-12-31 ENCOUNTER — Ambulatory Visit
Admission: RE | Admit: 2015-12-31 | Discharge: 2015-12-31 | Disposition: A | Discharge status: Home / Self Care | Payer: BLUE CROSS/BLUE SHIELD | Source: Ambulatory Visit | Attending: Neurology | Admitting: Neurology

## 2015-12-31 DIAGNOSIS — H93A1 Pulsatile tinnitus, right ear: Secondary | ICD-10-CM

## 2016-01-21 ENCOUNTER — Ambulatory Visit: Payer: BLUE CROSS/BLUE SHIELD | Attending: Neurology

## 2016-01-21 DIAGNOSIS — Z79899 Other long term (current) drug therapy: Secondary | ICD-10-CM | POA: Insufficient documentation

## 2016-01-21 DIAGNOSIS — R413 Other amnesia: Secondary | ICD-10-CM | POA: Diagnosis not present

## 2016-01-21 DIAGNOSIS — K219 Gastro-esophageal reflux disease without esophagitis: Secondary | ICD-10-CM | POA: Insufficient documentation

## 2016-01-21 DIAGNOSIS — G4733 Obstructive sleep apnea (adult) (pediatric): Secondary | ICD-10-CM | POA: Insufficient documentation

## 2016-01-21 DIAGNOSIS — Z7951 Long term (current) use of inhaled steroids: Secondary | ICD-10-CM | POA: Diagnosis not present

## 2016-01-21 DIAGNOSIS — Z853 Personal history of malignant neoplasm of breast: Secondary | ICD-10-CM | POA: Diagnosis not present

## 2016-01-21 DIAGNOSIS — R0683 Snoring: Secondary | ICD-10-CM | POA: Diagnosis present

## 2016-02-10 ENCOUNTER — Encounter: Payer: Self-pay | Admitting: Family Medicine

## 2016-02-10 ENCOUNTER — Ambulatory Visit (INDEPENDENT_AMBULATORY_CARE_PROVIDER_SITE_OTHER): Payer: 59 | Admitting: Family Medicine

## 2016-02-10 VITALS — BP 124/62 | HR 72 | Temp 97.6°F | Wt 220.8 lb

## 2016-02-10 DIAGNOSIS — I6523 Occlusion and stenosis of bilateral carotid arteries: Secondary | ICD-10-CM | POA: Diagnosis not present

## 2016-02-10 DIAGNOSIS — G4733 Obstructive sleep apnea (adult) (pediatric): Secondary | ICD-10-CM

## 2016-02-10 DIAGNOSIS — H9311 Tinnitus, right ear: Secondary | ICD-10-CM | POA: Diagnosis not present

## 2016-02-10 LAB — COMPREHENSIVE METABOLIC PANEL
ALBUMIN: 3.9 g/dL (ref 3.5–5.2)
ALK PHOS: 119 U/L — AB (ref 39–117)
ALT: 14 U/L (ref 0–35)
AST: 16 U/L (ref 0–37)
BUN: 14 mg/dL (ref 6–23)
CO2: 24 mEq/L (ref 19–32)
CREATININE: 0.87 mg/dL (ref 0.40–1.20)
Calcium: 9.5 mg/dL (ref 8.4–10.5)
Chloride: 110 mEq/L (ref 96–112)
GFR: 70.43 mL/min (ref >60.00)
GLUCOSE: 118 mg/dL — AB (ref 70–99)
POTASSIUM: 4.1 meq/L (ref 3.5–5.1)
SODIUM: 141 meq/L (ref 135–145)
TOTAL PROTEIN: 7 g/dL (ref 6.0–8.3)
Total Bilirubin: 0.6 mg/dL (ref 0.2–1.2)

## 2016-02-10 LAB — LIPID PANEL
Cholesterol: 156 mg/dL (ref 0–200)
HDL: 52.6 mg/dL (ref >39.00)
LDL Cholesterol: 82 mg/dL (ref 0–99)
NONHDL: 103.69
Total CHOL/HDL Ratio: 3
Triglycerides: 106 mg/dL (ref 0.0–149.0)
VLDL: 21.2 mg/dL (ref 0.0–40.0)

## 2016-02-10 NOTE — Progress Notes (Signed)
Pre visit review using our clinic review tool, if applicable. No additional management support is needed unless otherwise documented below in the visit note. 

## 2016-02-10 NOTE — Assessment & Plan Note (Signed)
>  25 minutes spent in face to face time with patient, >50% spent in counselling or coordination of care reviewing notes and discussing with pt- OSA, arterial stenosis and tinnitus. Will check lipid panel today. The patient indicates understanding of these issues and agrees with the plan.

## 2016-02-10 NOTE — Progress Notes (Signed)
Subjective:   Patient ID: Ariana Lopez, female    DOB: [birth year], 60 y.o.   MRN: [id number]  Ariana Lopez is a pleasant 60 y.o. year old female who presents to clinic today with Follow-up  on 02/10/2016  HPI:  Wants to discuss what has been going on with her health over the past few months.  Has had difficulty with right ear tinnitus, saw ENT who did brain imaging and found incidental intracranial arterial stenosis, referred her to Dr. Manuella Ghazi (neuro).  Notes reviewed, most recently from 02/04/16- initially placed on topimax for tinnitus but this caused tingling in her extremities. Now on Trokendi which causes less side effects but has not helped with tinnitus.  Placed on Zocor and ASA.    Also sent for sleep study, she does have OSA, has not yet been fitting for a CPAP.  Lab Results  Component Value Date   CHOL 210 (H) 10/02/2012   HDL 55.10 10/02/2012   LDLDIRECT 147.8 10/02/2012   TRIG 101.0 10/02/2012   CHOLHDL 4 10/02/2012     Current Outpatient Prescriptions on File Prior to Visit  Medication Sig Dispense Refill  . fluticasone (FLONASE) 50 MCG/ACT nasal spray PLACE 2 SPRAYS INTO BOTH NOSTRILS DAILY. 16 g 3   No current facility-administered medications on file prior to visit.     Allergies  Allergen Reactions  . Augmentin [Amoxicillin-Pot Clavulanate]     Insomnia/headache /nausea. Able to take PCN and Amox.  . Gabapentin Nausea Only    Past Medical History:  Diagnosis Date  . Breast cancer [facility])     Past Surgical History:  Procedure Laterality Date  . left breast lumpectomy Left 2002    Family History  Problem Relation Age of Onset  . Cancer Sister 43    breast    Social History   Social History  . Marital status: Married    Spouse name: N/A  . Number of children: N/A  . Years of education: N/A   Occupational History  . Not on file.   Social History Main Topics  . Smoking status: Former Research scientist (life sciences)  . Smokeless tobacco: Never Used  .  Alcohol use Yes     Comment: occ  . Drug use: No  . Sexual activity: Not on file   Other Topics Concern  . Not on file   Social History Narrative   G1P1   Married.   Recently moved here from [place].            The PMH, PSH, Social History, Family History, Medications, and allergies have been reviewed in [facility], and have been updated if relevant.   Review of Systems  HENT: Positive for tinnitus. Negative for ear pain, hearing loss, mouth sores, rhinorrhea, sinus pain, sneezing and sore throat.   Eyes: Negative.   Respiratory: Negative.   Cardiovascular: Negative.   Gastrointestinal: Negative.   Endocrine: Negative.   Genitourinary: Negative.   Musculoskeletal: Negative.   Allergic/Immunologic: Negative.   Neurological: Negative.   Hematological: Negative.   Psychiatric/Behavioral: Negative.   All other systems reviewed and are negative.      Objective:    BP 124/62   Pulse 72   Temp 97.6 F (36.4 C) (Oral)   Wt 220 lb 12 oz (100.1 kg)   SpO2 93%   BMI 34.57 kg/m    Physical Exam  Constitutional: She is oriented to person, place, and time. She appears well-developed and well-nourished. No distress.  HENT:  Head: Normocephalic and atraumatic.  Eyes: Conjunctivae are normal.  Cardiovascular: Normal rate.   Pulmonary/Chest: Effort normal.  Musculoskeletal: Normal range of motion. She exhibits no edema.  Neurological: She is alert and oriented to person, place, and time. No cranial nerve deficit.  Skin: Skin is warm and dry. She is not diaphoretic.  Psychiatric: She has a normal mood and affect. Her behavior is normal. Judgment and thought content normal.  Nursing note and vitals reviewed.         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

## 2016-02-10 NOTE — Patient Instructions (Signed)
Great to see you. I will call you with your lab results.   

## 2016-02-17 ENCOUNTER — Ambulatory Visit: Payer: BLUE CROSS/BLUE SHIELD | Admitting: Family Medicine

## 2016-02-17 ENCOUNTER — Encounter: Payer: Self-pay | Admitting: Family Medicine

## 2016-02-17 ENCOUNTER — Ambulatory Visit (INDEPENDENT_AMBULATORY_CARE_PROVIDER_SITE_OTHER): Payer: 59 | Admitting: Family Medicine

## 2016-02-17 VITALS — BP 114/68 | HR 79 | Temp 97.7°F | Wt 218.5 lb

## 2016-02-17 DIAGNOSIS — J069 Acute upper respiratory infection, unspecified: Secondary | ICD-10-CM | POA: Diagnosis not present

## 2016-02-17 MED ORDER — HYDROCOD POLST-CPM POLST ER 10-8 MG/5ML PO SUER: 5.0000 mL | Freq: Two times a day (BID) | ORAL | 0 refills | Status: DC | PRN

## 2016-02-17 MED ORDER — AZITHROMYCIN 250 MG PO TABS: ORAL_TABLET | ORAL | 0 refills | Status: DC

## 2016-02-17 NOTE — Progress Notes (Signed)
SUBJECTIVE:  Ariana Lopez is a 60 y.o. female who complains of coryza, congestion and productive cough for 6 days. She denies a history of anorexia and chest pain and denies a history of asthma. Patient denies smoke cigarettes.   Current Outpatient Prescriptions on File Prior to Visit  Medication Sig Dispense Refill  . azelastine (ASTELIN) 0.1 % nasal spray Place into the nose.    . fluticasone (FLONASE) 50 MCG/ACT nasal spray PLACE 2 SPRAYS INTO BOTH NOSTRILS DAILY. 16 g 3  . simvastatin (ZOCOR) 20 MG tablet Take 20 mg by mouth daily.    Marland Kitchen triamcinolone (KENALOG) 0.025 % cream 3 (three) times daily.    Marland Kitchen TROKENDI XR 50 MG CP24 Take 50 mg by mouth daily.     No current facility-administered medications on file prior to visit.     Allergies  Allergen Reactions  . Augmentin [Amoxicillin-Pot Clavulanate]     Insomnia/headache /nausea. Able to take PCN and Amox.  . Gabapentin Nausea Only    Past Medical History:  Diagnosis Date  . Breast cancer [facility])     Past Surgical History:  Procedure Laterality Date  . left breast lumpectomy Left 2002    Family History  Problem Relation Age of Onset  . Cancer Sister 53    breast    Social History   Social History  . Marital status: Married    Spouse name: N/A  . Number of children: N/A  . Years of education: N/A   Occupational History  . Not on file.   Social History Main Topics  . Smoking status: Former Research scientist (life sciences)  . Smokeless tobacco: Never Used  . Alcohol use Yes     Comment: occ  . Drug use: No  . Sexual activity: Not on file   Other Topics Concern  . Not on file   Social History Narrative   G1P1   Married.   Recently moved here from [place].            The PMH, PSH, Social History, Family History, Medications, and allergies have been reviewed in [facility], and have been updated if relevant.  OBJECTIVE: BP 114/68   Pulse 79   Temp 97.7 F (36.5 C) (Oral)   Wt 218 lb 8 oz (99.1 kg)   SpO2 94%   BMI 34.22 kg/m    She appears well, vital signs are as noted. Ears normal.  Throat and pharynx normal.  Neck supple. No adenopathy in the neck. Nose is congested. Sinuses tender. The chest is clear, without wheezes or rales.  ASSESSMENT:  sinusitis  PLAN: Given duration and progression of symptoms, will treat for bacterial sinusitis with zpack. Tussionex rx printed and given to pt- discussed sedation precautions. Symptomatic therapy suggested: push fluids, rest and return office visit prn if symptoms persist or worsen. Call or return to clinic prn if these symptoms worsen or fail to improve as anticipated.

## 2016-02-17 NOTE — Progress Notes (Signed)
Pre visit review using our clinic review tool, if applicable. No additional management support is needed unless otherwise documented below in the visit note. 

## 2016-05-19 ENCOUNTER — Ambulatory Visit (INDEPENDENT_AMBULATORY_CARE_PROVIDER_SITE_OTHER): Payer: 59 | Admitting: Family Medicine

## 2016-05-19 ENCOUNTER — Other Ambulatory Visit (HOSPITAL_COMMUNITY)
Admission: RE | Admit: 2016-05-19 | Discharge: 2016-05-19 | Disposition: A | Discharge status: Home / Self Care | Payer: 59 | Source: Ambulatory Visit | Attending: Family Medicine | Admitting: Family Medicine

## 2016-05-19 ENCOUNTER — Encounter: Payer: Self-pay | Admitting: Family Medicine

## 2016-05-19 VITALS — BP 102/70 | HR 61 | Temp 97.6°F | Ht 67.0 in | Wt 220.0 lb

## 2016-05-19 DIAGNOSIS — Z853 Personal history of malignant neoplasm of breast: Secondary | ICD-10-CM | POA: Diagnosis not present

## 2016-05-19 DIAGNOSIS — Z01419 Encounter for gynecological examination (general) (routine) without abnormal findings: Secondary | ICD-10-CM | POA: Insufficient documentation

## 2016-05-19 DIAGNOSIS — Z0001 Encounter for general adult medical examination with abnormal findings: Secondary | ICD-10-CM

## 2016-05-19 DIAGNOSIS — Z23 Encounter for immunization: Secondary | ICD-10-CM | POA: Diagnosis not present

## 2016-05-19 DIAGNOSIS — L299 Pruritus, unspecified: Secondary | ICD-10-CM

## 2016-05-19 DIAGNOSIS — Z1211 Encounter for screening for malignant neoplasm of colon: Secondary | ICD-10-CM

## 2016-05-19 DIAGNOSIS — G4733 Obstructive sleep apnea (adult) (pediatric): Secondary | ICD-10-CM

## 2016-05-19 DIAGNOSIS — Z1151 Encounter for screening for human papillomavirus (HPV): Secondary | ICD-10-CM | POA: Insufficient documentation

## 2016-05-19 LAB — CBC WITH DIFFERENTIAL/PLATELET
BASOS PCT: 0.7 % (ref 0.0–3.0)
Basophils Absolute: 0 10*3/uL (ref 0.0–0.1)
EOS ABS: 0.1 10*3/uL (ref 0.0–0.7)
EOS PCT: 1.5 % (ref 0.0–5.0)
HCT: 40.6 % (ref 36.0–46.0)
HEMOGLOBIN: 13.3 g/dL (ref 12.0–15.0)
LYMPHS ABS: 1.9 10*3/uL (ref 0.7–4.0)
Lymphocytes Relative: 35.3 % (ref 12.0–46.0)
MCHC: 32.8 g/dL (ref 30.0–36.0)
MCV: 96.4 fl (ref 78.0–100.0)
MONO ABS: 0.3 10*3/uL (ref 0.1–1.0)
Monocytes Relative: 5.6 % (ref 3.0–12.0)
NEUTROS ABS: 3.1 10*3/uL (ref 1.4–7.7)
NEUTROS PCT: 56.9 % (ref 43.0–77.0)
PLATELETS: 250 10*3/uL (ref 150.0–400.0)
RBC: 4.21 Mil/uL (ref 3.87–5.11)
RDW: 12.9 % (ref 11.5–15.5)
WBC: 5.4 10*3/uL (ref 4.0–10.5)

## 2016-05-19 LAB — COMPREHENSIVE METABOLIC PANEL
ALBUMIN: 4 g/dL (ref 3.5–5.2)
ALT: 14 U/L (ref 0–35)
AST: 14 U/L (ref 0–37)
Alkaline Phosphatase: 117 U/L (ref 39–117)
BUN: 15 mg/dL (ref 6–23)
CHLORIDE: 113 meq/L — AB (ref 96–112)
CO2: 26 meq/L (ref 19–32)
CREATININE: 0.82 mg/dL (ref 0.40–1.20)
Calcium: 9.4 mg/dL (ref 8.4–10.5)
GFR: 75.34 mL/min (ref >60.00)
Glucose, Bld: 97 mg/dL (ref 70–99)
POTASSIUM: 3.9 meq/L (ref 3.5–5.1)
SODIUM: 143 meq/L (ref 135–145)
Total Bilirubin: 0.5 mg/dL (ref 0.2–1.2)
Total Protein: 7 g/dL (ref 6.0–8.3)

## 2016-05-19 LAB — LIPID PANEL
CHOL/HDL RATIO: 3
CHOLESTEROL: 148 mg/dL (ref 0–200)
HDL: 55.8 mg/dL (ref >39.00)
LDL CALC: 82 mg/dL (ref 0–99)
NonHDL: 92.52
TRIGLYCERIDES: 55 mg/dL (ref 0.0–149.0)
VLDL: 11 mg/dL (ref 0.0–40.0)

## 2016-05-19 LAB — TSH: TSH: 0.48 u[IU]/mL (ref 0.35–4.50)

## 2016-05-19 NOTE — Assessment & Plan Note (Signed)
?   Allergic to her new dog. Advised benadryl 25 mg qhs to help with itching and insomnia.

## 2016-05-19 NOTE — Progress Notes (Signed)
Pre visit review using our clinic review tool, if applicable. No additional management support is needed unless otherwise documented below in the visit note. 

## 2016-05-19 NOTE — Assessment & Plan Note (Signed)
Due for mammogram.

## 2016-05-19 NOTE — Addendum Note (Signed)
Addended by: Pilar Grammes on: 05/19/2016 10:20 AM   Modules accepted: Orders

## 2016-05-19 NOTE — Progress Notes (Signed)
Subjective:   Patient ID: Ariana Lopez, female    DOB: 1956/03/12, 61 y.o.   MRN: [id number]  Ariana Lopez is a pleasant 61 y.o. year old female who presents to clinic today with Annual Exam; Sleeping Problem; and Pruritis  on 05/19/2016  HPI:  Mammogram 08/31/2013 Unsure when last colonoscopy was.  She has been itching since they got a new dog.  No rashes.   Not sleeping as well over the past two weeks either- ? If because of the dog.  Followed by Dr. Manuella Ghazi, neurology, for migraines.  HLD- taking zocor 20 mg daily. Lab Results  Component Value Date   CHOL 156 02/10/2016   HDL 52.60 02/10/2016   LDLCALC 82 02/10/2016   LDLDIRECT 147.8 10/02/2012   TRIG 106.0 02/10/2016   CHOLHDL 3 02/10/2016   Lab Results  Component Value Date   ALT 14 02/10/2016   AST 16 02/10/2016   ALKPHOS 119 (H) 02/10/2016   BILITOT 0.6 02/10/2016    No results found for: WBC, HGB, HCT, MCV, PLT Lab Results  Component Value Date   TSH 0.47 10/02/2012     Current Outpatient Prescriptions on File Prior to Visit  Medication Sig Dispense Refill  . azelastine (ASTELIN) 0.1 % nasal spray Place into the nose.    . fluticasone (FLONASE) 50 MCG/ACT nasal spray PLACE 2 SPRAYS INTO BOTH NOSTRILS DAILY. 16 g 3  . simvastatin (ZOCOR) 20 MG tablet Take 20 mg by mouth daily.    Marland Kitchen triamcinolone (KENALOG) 0.025 % cream 3 (three) times daily.    Marland Kitchen TROKENDI XR 50 MG CP24 Take 50 mg by mouth daily.     No current facility-administered medications on file prior to visit.     Allergies  Allergen Reactions  . Augmentin [Amoxicillin-Pot Clavulanate]     Insomnia/headache /nausea. Able to take PCN and Amox.  . Gabapentin Nausea Only    Past Medical History:  Diagnosis Date  . Breast cancer [facility])     Past Surgical History:  Procedure Laterality Date  . left breast lumpectomy Left 2002    Family History  Problem Relation Age of Onset  . Cancer Sister 80    breast    Social History   Social  History  . Marital status: Married    Spouse name: N/A  . Number of children: N/A  . Years of education: N/A   Occupational History  . Not on file.   Social History Main Topics  . Smoking status: Former Research scientist (life sciences)  . Smokeless tobacco: Never Used  . Alcohol use Yes     Comment: occ  . Drug use: No  . Sexual activity: Not on file   Other Topics Concern  . Not on file   Social History Narrative   G1P1   Married.   Recently moved here from [place].            The PMH, PSH, Social History, Family History, Medications, and allergies have been reviewed in [facility], and have been updated if relevant.   Review of Systems  Constitutional: Negative.   HENT: Negative.   Eyes: Negative.   Respiratory: Negative.   Cardiovascular: Negative.   Gastrointestinal: Negative.   Endocrine: Negative for heat intolerance.  Genitourinary: Negative.   Musculoskeletal: Negative.   Skin: Negative.   Allergic/Immunologic: Negative.   Neurological: Negative.   Hematological: Negative.   Psychiatric/Behavioral: Positive for sleep disturbance. Negative for agitation, behavioral problems, confusion, decreased concentration, dysphoric mood, hallucinations, self-injury and suicidal ideas.  The patient is not nervous/anxious and is not hyperactive.   All other systems reviewed and are negative.      Objective:    BP 102/70 (BP Location: Right Arm, Patient Position: Sitting, Cuff Size: Large)   Pulse 61   Temp 97.6 F (36.4 C) (Oral)   Ht 5\' 7"  (1.702 m)   Wt 220 lb (99.8 kg)   SpO2 94%   BMI 34.46 kg/m    Physical Exam    General:  Well-developed,well-nourished,in no acute distress; alert,appropriate and cooperative throughout examination Head:  normocephalic and atraumatic.   Eyes:  vision grossly intact, PERRL Ears:  R ear normal and L ear normal externally, TMs clear bilaterally Nose:  no external deformity.   Mouth:  good dentition.   Neck:  No deformities, masses, or tenderness  noted. Breasts:  No mass, nodules, thickening, tenderness, bulging, retraction, inflamation, nipple discharge or skin changes noted.   Lungs:  Normal respiratory effort, chest expands symmetrically. Lungs are clear to auscultation, no crackles or wheezes. Heart:  Normal rate and regular rhythm. S1 and S2 normal without gallop, murmur, click, rub or other extra sounds. Abdomen:  Bowel sounds positive,abdomen soft and non-tender without masses, organomegaly or hernias noted. Rectal:  no external abnormalities.   Genitalia:  Pelvic Exam:        External: normal female genitalia without lesions or masses        Vagina: normal without lesions or masses        Cervix: normal without lesions or masses        Adnexa: normal bimanual exam without masses or fullness        Uterus: normal by palpation        Pap smear: performed Msk:  No deformity or scoliosis noted of thoracic or lumbar spine.   Extremities:  No clubbing, cyanosis, edema, or deformity noted with normal full range of motion of all joints.   Neurologic:  alert & oriented X3 and gait normal.   Skin:  Intact without suspicious lesions or rashes Cervical Nodes:  No lymphadenopathy noted Axillary Nodes:  No palpable lymphadenopathy Psych:  Cognition and judgment appear intact. Alert and cooperative with normal attention span and concentration. No apparent delusions, illusions, hallucinations      Assessment & Plan:   Well woman exam - Plan: CBC with Differential/Platelet, Comprehensive metabolic panel, Lipid panel, TSH, Hepatitis C Antibody  History of breast cancer  Obesity, morbid (HCC)  OSA (obstructive sleep apnea)  Screening for colon cancer - Plan: Ambulatory referral to Gastroenterology No Follow-up on file.

## 2016-05-19 NOTE — Assessment & Plan Note (Signed)
Reviewed preventive care protocols, scheduled due services, and updated immunizations Discussed nutrition, exercise, diet, and healthy lifestyle.  Pap smear performed today.  Refer to GI for colonoscopy. Mammogram ordered today.  Orders Placed This Encounter  Procedures  . CBC with Differential/Platelet  . Comprehensive metabolic panel  . Lipid panel  . TSH  . Ambulatory referral to Gastroenterology

## 2016-05-19 NOTE — Patient Instructions (Signed)
Great to see you. I will call you with your lab results and you can view them online.  We are referring you for a colonoscopy.  Please call to schedule your mammogram.

## 2016-05-20 LAB — CYTOLOGY - PAP
ADEQUACY: ABSENT
Diagnosis: NEGATIVE
HPV (WINDOPATH): NOT DETECTED

## 2016-05-20 LAB — HEPATITIS C ANTIBODY: HCV Ab: NEGATIVE

## 2016-05-23 ENCOUNTER — Telehealth: Payer: Self-pay

## 2016-05-23 ENCOUNTER — Other Ambulatory Visit: Payer: Self-pay

## 2016-05-23 NOTE — Telephone Encounter (Signed)
Gastroenterology Pre-Procedure Review  Request Date:  Requesting Physician: Dr.   PATIENT REVIEW QUESTIONS: The patient responded to the following health history questions as indicated:    1. Are you having any GI issues? no 2. Do you have a personal history of Polyps? no 3. Do you have a family history of Colon Cancer or Polyps? no 4. Diabetes Mellitus? no 5. Joint replacements in the past 12 months?no 6. Major health problems in the past 3 months?no 7. Any artificial heart valves, MVP, or defibrillator?no    MEDICATIONS & ALLERGIES:    Patient reports the following regarding taking any anticoagulation/antiplatelet therapy:   Plavix, Coumadin, Eliquis, Xarelto, Lovenox, Pradaxa, Brilinta, or Effient? no Aspirin? no  Patient confirms/reports the following medications:  Current Outpatient Prescriptions  Medication Sig Dispense Refill  . aspirin EC 81 MG tablet Take 81 mg by mouth daily.    Marland Kitchen azelastine (ASTELIN) 0.1 % nasal spray Place into the nose.    . fluticasone (FLONASE) 50 MCG/ACT nasal spray PLACE 2 SPRAYS INTO BOTH NOSTRILS DAILY. 16 g 3  . simvastatin (ZOCOR) 20 MG tablet Take 20 mg by mouth daily.    Marland Kitchen triamcinolone (KENALOG) 0.025 % cream 3 (three) times daily.    Marland Kitchen TROKENDI XR 50 MG CP24 Take 50 mg by mouth daily.     No current facility-administered medications for this visit.     Patient confirms/reports the following allergies:  Allergies  Allergen Reactions  . Augmentin [Amoxicillin-Pot Clavulanate]     Insomnia/headache /nausea. Able to take PCN and Amox.  . Gabapentin Nausea Only    No orders of the defined types were placed in this encounter.   AUTHORIZATION INFORMATION Primary Insurance: 1D#: Group #:  Secondary Insurance: 1D#: Group #:  SCHEDULE INFORMATION: Date: 06/10/16 Time: Location: [place]

## 2016-05-24 ENCOUNTER — Encounter: Payer: Self-pay | Admitting: *Deleted

## 2016-06-09 ENCOUNTER — Encounter: Payer: Self-pay | Admitting: *Deleted

## 2016-06-10 ENCOUNTER — Ambulatory Visit: Payer: BLUE CROSS/BLUE SHIELD | Admitting: Certified Registered"

## 2016-06-10 ENCOUNTER — Encounter: Payer: Self-pay | Admitting: Anesthesiology

## 2016-06-10 ENCOUNTER — Ambulatory Visit
Admission: RE | Admit: 2016-06-10 | Discharge: 2016-06-10 | Disposition: A | Discharge status: Home / Self Care | Payer: BLUE CROSS/BLUE SHIELD | Source: Ambulatory Visit | Attending: Gastroenterology | Admitting: Gastroenterology

## 2016-06-10 ENCOUNTER — Encounter
Admission: RE | Disposition: A | Discharge status: Home / Self Care | Payer: Self-pay | Source: Ambulatory Visit | Attending: Gastroenterology

## 2016-06-10 DIAGNOSIS — I739 Peripheral vascular disease, unspecified: Secondary | ICD-10-CM | POA: Insufficient documentation

## 2016-06-10 DIAGNOSIS — Z79899 Other long term (current) drug therapy: Secondary | ICD-10-CM | POA: Diagnosis not present

## 2016-06-10 DIAGNOSIS — G4730 Sleep apnea, unspecified: Secondary | ICD-10-CM | POA: Diagnosis not present

## 2016-06-10 DIAGNOSIS — Z7951 Long term (current) use of inhaled steroids: Secondary | ICD-10-CM | POA: Diagnosis not present

## 2016-06-10 DIAGNOSIS — Z87891 Personal history of nicotine dependence: Secondary | ICD-10-CM | POA: Diagnosis not present

## 2016-06-10 DIAGNOSIS — Z1211 Encounter for screening for malignant neoplasm of colon: Secondary | ICD-10-CM

## 2016-06-10 DIAGNOSIS — Z7982 Long term (current) use of aspirin: Secondary | ICD-10-CM | POA: Diagnosis not present

## 2016-06-10 HISTORY — PX: COLONOSCOPY WITH PROPOFOL: SHX5780

## 2016-06-10 SURGERY — COLONOSCOPY WITH PROPOFOL
Anesthesia: General

## 2016-06-10 MED ORDER — PROPOFOL 500 MG/50ML IV EMUL
INTRAVENOUS | Status: DC | PRN
  Administered 2016-06-10: 120 ug/kg/min via INTRAVENOUS

## 2016-06-10 MED ORDER — PROPOFOL 10 MG/ML IV BOLUS
INTRAVENOUS | Status: AC
  Filled 2016-06-10: qty 20

## 2016-06-10 MED ORDER — SODIUM CHLORIDE 0.9 % IV SOLN
INTRAVENOUS | Status: DC
  Administered 2016-06-10: 1000 mL via INTRAVENOUS

## 2016-06-10 MED ORDER — LIDOCAINE HCL (PF) 2 % IJ SOLN
INTRAMUSCULAR | Status: AC
  Filled 2016-06-10: qty 2

## 2016-06-10 MED ORDER — PHENYLEPHRINE HCL 10 MG/ML IJ SOLN
INTRAMUSCULAR | Status: DC | PRN
  Administered 2016-06-10 (×2): 100 ug via INTRAVENOUS

## 2016-06-10 MED ORDER — LIDOCAINE 2% (20 MG/ML) 5 ML SYRINGE
INTRAMUSCULAR | Status: DC | PRN
  Administered 2016-06-10: 25 mg via INTRAVENOUS

## 2016-06-10 MED ORDER — PROPOFOL 10 MG/ML IV BOLUS
INTRAVENOUS | Status: DC | PRN
  Administered 2016-06-10: 30 mg via INTRAVENOUS
  Administered 2016-06-10: 70 mg via INTRAVENOUS

## 2016-06-10 NOTE — Op Note (Signed)
[facility] Patient Name: Ariana Lopez Procedure Date: 06/10/2016 10:23 AM MRN: [id number] Account #: [id number] Date of Birth: September 29, 1955 Admit Type: Outpatient Age: 61 Room: [facility] ENDO ROOM 4 Gender: Female Note Status: Finalized Procedure:            Colonoscopy Indications:          Screening for colorectal malignant neoplasm Providers:            Jonathon Bellows MD, MD Referring MD:         No Local Md, MD (Referring MD) Medicines:            Monitored Anesthesia Care Complications:        No immediate complications. Procedure:            Pre-Anesthesia Assessment:                       - Prior to the procedure, a History and Physical was                        performed, and patient medications, allergies and                        sensitivities were reviewed. The patient's tolerance of                        previous anesthesia was reviewed.                       - The risks and benefits of the procedure and the                        sedation options and risks were discussed with the                        patient. All questions were answered and informed                        consent was obtained.                       - ASA Grade Assessment: III - A patient with severe                        systemic disease.                       After obtaining informed consent, the colonoscope was                        passed under direct vision. Throughout the procedure,                        the patient's blood pressure, pulse, and oxygen                        saturations were monitored continuously. The Olympus                        CF-HQ190L Colonoscope (S#. [id number]) was introduced  through the anus and advanced to the the cecum,                        identified by the appendiceal orifice, IC valve and                        transillumination. The colonoscopy was performed with                        ease. The patient  tolerated the procedure well. The                        quality of the bowel preparation was good. Findings:      The perianal and digital rectal examinations were normal.      The entire examined colon appeared normal on direct and retroflexion       views. Impression:           - The entire examined colon is normal on direct and                        retroflexion views.                       - No specimens collected. Recommendation:       - Discharge patient to home (with escort).                       - Resume previous diet.                       - Continue present medications.                       - Repeat colonoscopy in 10 years for screening purposes. Procedure Code(s):    --- Professional ---                       T6144, Colorectal cancer screening; colonoscopy on                        individual not meeting criteria for high risk Diagnosis Code(s):    --- Professional ---                       Z12.11, Encounter for screening for malignant neoplasm                        of colon CPT copyright 2016 American Medical Association. All rights reserved. The codes documented in this report are preliminary and upon coder review may  be revised to meet current compliance requirements. Jonathon Bellows, MD Jonathon Bellows MD, MD 06/10/2016 10:49:36 AM This report has been signed electronically. Number of Addenda: 0 Note Initiated On: 06/10/2016 10:23 AM Scope Withdrawal Time: 0 hours 11 minutes 29 seconds  Total Procedure Duration: 0 hours 18 minutes 14 seconds       [facility]

## 2016-06-10 NOTE — Anesthesia Post-op Follow-up Note (Cosign Needed)
Anesthesia QCDR form completed.        

## 2016-06-10 NOTE — Transfer of Care (Signed)
Immediate Anesthesia Transfer of Care Note  Patient: Elaysha Nancarrow  Procedure(s) Performed: Procedure(s): COLONOSCOPY WITH PROPOFOL (N/A)  Patient Location: Endoscopy Unit  Anesthesia Type:General  Level of Consciousness: awake and alert   Airway & Oxygen Therapy: Patient Spontanous Breathing and Patient connected to nasal cannula oxygen  Post-op Assessment: Report given to RN and Post -op Vital signs reviewed and stable  Post vital signs: Reviewed  Last Vitals:  Vitals:   06/10/16 1003 06/10/16 1051  BP: 126/69 (!) 92/51  Pulse: 72 69  Resp: 16 18  Temp: 36.3 C 36.3 C    Last Pain:  Vitals:   06/10/16 1003  TempSrc: Tympanic         Complications: No apparent anesthesia complications

## 2016-06-10 NOTE — Anesthesia Preprocedure Evaluation (Signed)
Anesthesia Evaluation  Patient identified by MRN, date of birth, ID band Patient awake    Reviewed: Allergy & Precautions, H&P , NPO status , Patient's Chart, lab work & pertinent test results  History of Anesthesia Complications Negative for: history of anesthetic complications  Airway Mallampati: III  TM Distance: >3 FB Neck ROM: full    Dental  (+) Poor Dentition, Chipped, Missing   Pulmonary neg shortness of breath, sleep apnea , former smoker,    Pulmonary exam normal breath sounds clear to auscultation       Cardiovascular Exercise Tolerance: Good (-) angina+ Peripheral Vascular Disease  (-) Past MI and (-) DOE Normal cardiovascular exam Rhythm:regular Rate:Normal     Neuro/Psych negative neurological ROS  negative psych ROS   GI/Hepatic negative GI ROS, Neg liver ROS, neg GERD  ,  Endo/Other  negative endocrine ROS  Renal/GU negative Renal ROS  negative genitourinary   Musculoskeletal   Abdominal   Peds  Hematology negative hematology ROS (+)   Anesthesia Other Findings Past Medical History: No date: Breast cancer [facility])  Past Surgical History: No date: BREAST SURGERY 2002: left breast lumpectomy Left  BMI    Body Mass Index:  34.46 kg/m      Reproductive/Obstetrics negative OB ROS                             Anesthesia Physical Anesthesia Plan  ASA: III  Anesthesia Plan: General   Post-op Pain Management:    Induction:   Airway Management Planned:   Additional Equipment:   Intra-op Plan:   Post-operative Plan:   Informed Consent: I have reviewed the patients History and Physical, chart, labs and discussed the procedure including the risks, benefits and alternatives for the proposed anesthesia with the patient or authorized representative who has indicated his/her understanding and acceptance.   Dental Advisory Given  Plan Discussed with: Anesthesiologist,  CRNA and Surgeon  Anesthesia Plan Comments:         Anesthesia Quick Evaluation

## 2016-06-10 NOTE — Anesthesia Postprocedure Evaluation (Signed)
Anesthesia Post Note  Patient: Ariana Lopez  Procedure(s) Performed: Procedure(s) (LRB): COLONOSCOPY WITH PROPOFOL (N/A)  Patient location during evaluation: Endoscopy Anesthesia Type: General Level of consciousness: awake and alert Pain management: pain level controlled Vital Signs Assessment: post-procedure vital signs reviewed and stable Respiratory status: spontaneous breathing, nonlabored ventilation, respiratory function stable and patient connected to nasal cannula oxygen Cardiovascular status: blood pressure returned to baseline and stable Postop Assessment: no signs of nausea or vomiting Anesthetic complications: no     Last Vitals:  Vitals:   06/10/16 1120 06/10/16 1125  BP: 116/61 (!) 130/98  Pulse: (!) 57 (!) 56  Resp: (!) 22 20  Temp:      Last Pain:  Vitals:   06/10/16 1003  TempSrc: Tympanic                 Ariana Lopez

## 2016-06-10 NOTE — H&P (Signed)
  Jonathon Bellows MD [address] [place], [place] [address] Phone: [phone] Fax : [phone]  Primary Care Physician:  Arnette Norris, MD Primary Gastroenterologist:  Dr. Jonathon Bellows   Pre-Procedure History & Physical: HPI:  Ariana Lopez is a 61 y.o. female is here for an colonoscopy.   Past Medical History:  Diagnosis Date  . Breast cancer [facility])     Past Surgical History:  Procedure Laterality Date  . BREAST SURGERY    . left breast lumpectomy Left 2002    Prior to Admission medications   Medication Sig Start Date End Date Taking? Authorizing Provider  aspirin EC 81 MG tablet Take 81 mg by mouth daily.   Yes Historical Provider, MD  azelastine (ASTELIN) 0.1 % nasal spray Place into the nose.   Yes Historical Provider, MD  fluticasone (FLONASE) 50 MCG/ACT nasal spray PLACE 2 SPRAYS INTO BOTH NOSTRILS DAILY. 08/03/15  Yes Lucille Passy, MD  simvastatin (ZOCOR) 20 MG tablet Take 20 mg by mouth daily. 12/15/15 06/12/16 Yes Historical Provider, MD  triamcinolone (KENALOG) 0.025 % cream 3 (three) times daily. 11/27/15  Yes Historical Provider, MD  TROKENDI XR 50 MG CP24 Take 50 mg by mouth daily. 02/08/16  Yes Historical Provider, MD    Allergies as of 05/23/2016 - Review Complete 05/19/2016  Allergen Reaction Noted  . Augmentin [amoxicillin-pot clavulanate]  05/03/2013  . Gabapentin Nausea Only 06/25/2014    Family History  Problem Relation Age of Onset  . Cancer Sister 83    breast    Social History   Social History  . Marital status: Married    Spouse name: N/A  . Number of children: N/A  . Years of education: N/A   Occupational History  . Not on file.   Social History Main Topics  . Smoking status: Former Research scientist (life sciences)  . Smokeless tobacco: Never Used  . Alcohol use Yes     Comment: occ  . Drug use: No  . Sexual activity: Not on file   Other Topics Concern  . Not on file   Social History Narrative   G1P1   Married.   Recently moved here from [place].               Review of Systems: See HPI, otherwise negative ROS  Physical Exam: BP 126/69   Pulse 72   Temp 97.3 F (36.3 C) (Tympanic)   Resp 16   Ht 5\' 7"  (1.702 m)   Wt 220 lb (99.8 kg)   SpO2 98%   BMI 34.46 kg/m  General:   Alert,  pleasant and cooperative in NAD Head:  Normocephalic and atraumatic. Neck:  Supple; no masses or thyromegaly. Lungs:  Clear throughout to auscultation.    Heart:  Regular rate and rhythm. Abdomen:  Soft, nontender and nondistended. Normal bowel sounds, without guarding, and without rebound.   Neurologic:  Alert and  oriented x4;  grossly normal neurologically.  Impression/Plan: Shadi Larner is here for an colonoscopy to be performed for Screening colonoscopy average risk    Risks, benefits, limitations, and alternatives regarding  colonoscopy have been reviewed with the patient.  Questions have been answered.  All parties agreeable.   Jonathon Bellows, MD  06/10/2016, 10:07 AM

## 2016-06-13 ENCOUNTER — Encounter: Payer: Self-pay | Admitting: Gastroenterology

## 2016-06-27 ENCOUNTER — Other Ambulatory Visit: Payer: Self-pay | Admitting: Otolaryngology

## 2016-06-27 DIAGNOSIS — R221 Localized swelling, mass and lump, neck: Secondary | ICD-10-CM

## 2016-06-30 ENCOUNTER — Ambulatory Visit
Admission: RE | Admit: 2016-06-30 | Discharge: 2016-06-30 | Disposition: A | Discharge status: Home / Self Care | Payer: BLUE CROSS/BLUE SHIELD | Source: Ambulatory Visit | Attending: Otolaryngology | Admitting: Otolaryngology

## 2016-06-30 DIAGNOSIS — E041 Nontoxic single thyroid nodule: Secondary | ICD-10-CM | POA: Insufficient documentation

## 2016-06-30 DIAGNOSIS — R221 Localized swelling, mass and lump, neck: Secondary | ICD-10-CM | POA: Insufficient documentation

## 2016-06-30 HISTORY — DX: Malignant neoplasm of unspecified site of left female breast: C50.912

## 2016-06-30 MED ORDER — IOPAMIDOL (ISOVUE-300) INJECTION 61%
75.0000 mL | Freq: Once | INTRAVENOUS | Status: AC | PRN
  Administered 2016-06-30: 75 mL via INTRAVENOUS

## 2016-07-18 ENCOUNTER — Other Ambulatory Visit: Payer: Self-pay | Admitting: Otolaryngology

## 2016-07-18 DIAGNOSIS — E041 Nontoxic single thyroid nodule: Secondary | ICD-10-CM

## 2016-07-27 ENCOUNTER — Ambulatory Visit
Admission: RE | Admit: 2016-07-27 | Discharge: 2016-07-27 | Disposition: A | Discharge status: Home / Self Care | Payer: BLUE CROSS/BLUE SHIELD | Source: Ambulatory Visit | Attending: Otolaryngology | Admitting: Otolaryngology

## 2016-07-27 DIAGNOSIS — E041 Nontoxic single thyroid nodule: Secondary | ICD-10-CM | POA: Insufficient documentation

## 2016-07-29 ENCOUNTER — Telehealth: Payer: Self-pay | Admitting: Family Medicine

## 2016-07-29 ENCOUNTER — Other Ambulatory Visit: Payer: Self-pay | Admitting: Otolaryngology

## 2016-07-29 DIAGNOSIS — E041 Nontoxic single thyroid nodule: Secondary | ICD-10-CM

## 2016-07-29 NOTE — Telephone Encounter (Signed)
I spoke with pt and she has to be to work at 12 noon; pt has frequency and burning upon urination. Pt cannot go to Sat clinic due to work schedule. Pt scheduled appt with Dr Deborra Medina on 08/01/16 at 8:15. If condition changes or worsens prior to appt pt will go to UC. FYI to Dr Deborra Medina.

## 2016-07-29 NOTE — Telephone Encounter (Signed)
Patient Name: Ariana Lopez DOB: Oct 26, 1955 Initial Comment frequent and painful urination Nurse Assessment Nurse: Cherie Dark RN, Jarrett Soho Date/Time (Eastern Time): 07/29/2016 9:42:38 AM Confirm and document reason for call. If symptomatic, describe symptoms. ---Caller states she is having some frequent and painful, burning urination. She is drinking some cranberry juice. She is taking some antibiotics for an ear infection. Just woke up like this today. No fever. Does the patient have any new or worsening symptoms? ---Yes Will a triage be completed? ---Yes Related visit to physician within the last 2 weeks? ---Yes Does the PT have any chronic conditions? (i.e. diabetes, asthma, etc.) ---No Is this a behavioral health or substance abuse call? ---No Guidelines Guideline Title Affirmed Question Affirmed Notes Urination Pain - Female Age > 50 years Final Disposition User See Physician within 24 Hours [place], RN, Jarrett Soho Comments unable to make it to 11am appt and to work by 12pm Referrals REFERRED TO PCP OFFICE Disagree/Comply: Disagree Disagree/Comply Reason: Unable to find transportation

## 2016-08-01 ENCOUNTER — Ambulatory Visit (INDEPENDENT_AMBULATORY_CARE_PROVIDER_SITE_OTHER): Payer: 59 | Admitting: Family Medicine

## 2016-08-01 ENCOUNTER — Encounter: Payer: Self-pay | Admitting: Family Medicine

## 2016-08-01 ENCOUNTER — Telehealth: Payer: Self-pay | Admitting: *Deleted

## 2016-08-01 VITALS — BP 100/70 | HR 67 | Temp 97.3°F | Wt 223.0 lb

## 2016-08-01 DIAGNOSIS — N3000 Acute cystitis without hematuria: Secondary | ICD-10-CM | POA: Diagnosis not present

## 2016-08-01 LAB — POC URINALSYSI DIPSTICK (AUTOMATED)
BILIRUBIN UA: NEGATIVE
Blood, UA: NEGATIVE
GLUCOSE UA: NEGATIVE
Ketones, UA: NEGATIVE
Nitrite, UA: NEGATIVE
PH UA: 6.5 (ref 5.0–8.0)
Protein, UA: NEGATIVE
Spec Grav, UA: >=1.03 — AB (ref 1.010–1.025)
Urobilinogen, UA: 0.2 E.U./dL

## 2016-08-01 MED ORDER — CIPROFLOXACIN HCL 250 MG PO TABS: 250.0000 mg | ORAL_TABLET | Freq: Two times a day (BID) | ORAL | 0 refills | Status: DC

## 2016-08-01 MED ORDER — FLUTICASONE PROPIONATE 50 MCG/ACT NA SUSP: NASAL | 11 refills | Status: DC

## 2016-08-01 MED ORDER — FLUCONAZOLE 150 MG PO TABS: 150.0000 mg | ORAL_TABLET | Freq: Once | ORAL | 0 refills | Status: AC

## 2016-08-01 NOTE — Telephone Encounter (Signed)
eRxs resent and Im getting an error message.  Maybe something wrong with pharmacy eRx system  Can you please call in cipro and dilfucan as entered. Thanks!

## 2016-08-01 NOTE — Progress Notes (Signed)
SUBJECTIVE: Ariana Lopez is a 61 y.o. female who complains of urinary frequency, urgency and dysuria x 5 days, without flank pain, fever, chills, or abnormal vaginal discharge or bleeding.   Current Outpatient Prescriptions on File Prior to Visit  Medication Sig Dispense Refill  . aspirin EC 81 MG tablet Take 81 mg by mouth daily.    Marland Kitchen azelastine (ASTELIN) 0.1 % nasal spray Place into the nose.    . fluticasone (FLONASE) 50 MCG/ACT nasal spray PLACE 2 SPRAYS INTO BOTH NOSTRILS DAILY. 16 g 3  . triamcinolone (KENALOG) 0.025 % cream 3 (three) times daily.    Marland Kitchen TROKENDI XR 50 MG CP24 Take 50 mg by mouth daily.    . simvastatin (ZOCOR) 20 MG tablet Take 20 mg by mouth daily.     No current facility-administered medications on file prior to visit.     Allergies  Allergen Reactions  . Augmentin [Amoxicillin-Pot Clavulanate]     Insomnia/headache /nausea. Able to take PCN and Amox.  . Gabapentin Nausea Only    Past Medical History:  Diagnosis Date  . Breast cancer, left ([place]) 2005   Lumpectomy and sentinel lymph nodes resected, chemo + rad tx's.     Past Surgical History:  Procedure Laterality Date  . BREAST SURGERY    . COLONOSCOPY WITH PROPOFOL N/A 06/10/2016   Procedure: COLONOSCOPY WITH PROPOFOL;  Surgeon: Jonathon Bellows, MD;  Location: [facility] ENDOSCOPY;  Service: Endoscopy;  Laterality: N/A;  . left breast lumpectomy Left 2002    Family History  Problem Relation Age of Onset  . Cancer Sister 21    breast    Social History   Social History  . Marital status: Married    Spouse name: N/A  . Number of children: N/A  . Years of education: N/A   Occupational History  . Not on file.   Social History Main Topics  . Smoking status: Former Research scientist (life sciences)  . Smokeless tobacco: Never Used  . Alcohol use Yes     Comment: occ  . Drug use: No  . Sexual activity: Not on file   Other Topics Concern  . Not on file   Social History Narrative   G1P1   Married.   Recently moved here  from [place].            The PMH, PSH, Social History, Family History, Medications, and allergies have been reviewed in [facility], and have been updated if relevant.  OBJECTIVE:  BP 100/70 (BP Location: Right Arm, Patient Position: Sitting, Cuff Size: Large)   Pulse 67   Temp 97.3 F (36.3 C) (Oral)   Wt 223 lb (101.2 kg)   SpO2 92%   BMI 34.93 kg/m   Appears well, in no apparent distress.  Vital signs are normal. The abdomen is soft without tenderness, guarding, mass, rebound or organomegaly. No CVA tenderness or inguinal adenopathy noted. Urine dipstick shows positive for leukocytes.    ASSESSMENT: UTI uncomplicated without evidence of pyelonephritis  PLAN: Treatment per orders - cipro 500 mg twice daily x 5 days, also push fluids, may use Pyridium OTC prn. Call or return to clinic prn if these symptoms worsen or fail to improve as anticipated.

## 2016-08-01 NOTE — Addendum Note (Signed)
Addended by: Pilar Grammes on: 08/01/2016 08:45 AM   Modules accepted: Orders

## 2016-08-01 NOTE — Telephone Encounter (Signed)
I called both of them on VM

## 2016-08-01 NOTE — Telephone Encounter (Signed)
Pt states she was seen this am and given an abx. She was also to be prescribed diflucan and it was not at the pharmacy when she arrived. Medication not on current med list. Rx can be sent to pharmacy on file

## 2016-08-01 NOTE — Addendum Note (Signed)
Addended by: Pilar Grammes on: 08/01/2016 10:24 AM   Modules accepted: Orders

## 2016-08-01 NOTE — Progress Notes (Signed)
Pre visit review using our clinic review tool, if applicable. No additional management support is needed unless otherwise documented below in the visit note. 

## 2016-08-01 NOTE — Patient Instructions (Signed)

## 2016-08-03 LAB — URINE CULTURE

## 2016-08-08 ENCOUNTER — Ambulatory Visit
Admission: RE | Admit: 2016-08-08 | Discharge: 2016-08-08 | Disposition: A | Discharge status: Home / Self Care | Payer: BLUE CROSS/BLUE SHIELD | Source: Ambulatory Visit | Attending: Otolaryngology | Admitting: Otolaryngology

## 2016-08-08 ENCOUNTER — Ambulatory Visit: Payer: 59

## 2016-08-08 DIAGNOSIS — E041 Nontoxic single thyroid nodule: Secondary | ICD-10-CM | POA: Insufficient documentation

## 2016-08-08 NOTE — Procedures (Signed)
Dominant left thyroid nodule  Status post ultrasound left thyroid nodule FNA  No immediate complication  EBL 0  Full report in PACs

## 2016-08-19 ENCOUNTER — Ambulatory Visit (INDEPENDENT_AMBULATORY_CARE_PROVIDER_SITE_OTHER): Payer: 59 | Admitting: Family Medicine

## 2016-08-19 ENCOUNTER — Encounter: Payer: Self-pay | Admitting: Family Medicine

## 2016-08-19 VITALS — BP 106/70 | HR 64 | Temp 98.3°F | Ht 67.0 in | Wt 222.8 lb

## 2016-08-19 DIAGNOSIS — R3915 Urgency of urination: Secondary | ICD-10-CM

## 2016-08-19 LAB — POC URINALSYSI DIPSTICK (AUTOMATED)
Bilirubin, UA: NEGATIVE
GLUCOSE UA: NEGATIVE
Ketones, UA: NEGATIVE
Leukocytes, UA: NEGATIVE
NITRITE UA: NEGATIVE
Protein, UA: NEGATIVE
RBC UA: NEGATIVE
Spec Grav, UA: >=1.03 — AB (ref 1.010–1.025)
UROBILINOGEN UA: 0.2 U/dL
pH, UA: 5.5 (ref 5.0–8.0)

## 2016-08-19 NOTE — Progress Notes (Signed)
   Subjective:    Patient ID: Ariana Lopez, female    DOB: [birth year], 61 y.o.   MRN: [id number]  HPI  61 year old female presents for follow up acute cystitis.  She was treated by Dr. Deborra Medina on 5/7 for UTI with cipro 500 mg twice daily x 5 days  Urine culture was negative.. Contaminated.   Felt some better 1-2 days after taking antibiotics. She reports urinary frequency, urgency, dysuria. Rare incontinence. Urinating small amount each time.  Does not have to strain to urinate.  Urinates 2-3 times at night. No vaginal discharge.   Has some lower abdominal pain.  No fever. No flank pain   She has been drinking cranberry juice.  Drinks a lot of water. Some  Spicy, minimal caffeine.  Hx: no UTI in past. No recent catheterizations.  No new medications.  Nonsmoker   Review of Systems  Constitutional: Negative for fatigue and fever.  HENT: Negative for ear pain.   Eyes: Negative for pain.  Respiratory: Negative for chest tightness and shortness of breath.   Cardiovascular: Negative for chest pain, palpitations and leg swelling.  Gastrointestinal: Negative for abdominal pain.  Genitourinary: Negative for dysuria.       Objective:   Physical Exam  Constitutional: Vital signs are normal. She appears well-developed and well-nourished. She is cooperative.  Non-toxic appearance. She does not appear ill. No distress.  HENT:  Head: Normocephalic.  Right Ear: Hearing, tympanic membrane, external ear and ear canal normal. Tympanic membrane is not erythematous, not retracted and not bulging.  Left Ear: Hearing, tympanic membrane, external ear and ear canal normal. Tympanic membrane is not erythematous, not retracted and not bulging.  Nose: No mucosal edema or rhinorrhea. Right sinus exhibits no maxillary sinus tenderness and no frontal sinus tenderness. Left sinus exhibits no maxillary sinus tenderness and no frontal sinus tenderness.  Mouth/Throat: Uvula is midline, oropharynx is  clear and moist and mucous membranes are normal.  Eyes: Conjunctivae, EOM and lids are normal. Pupils are equal, round, and reactive to light. Lids are everted and swept, no foreign bodies found.  Neck: Trachea normal and normal range of motion. Neck supple. Carotid bruit is not present. No thyroid mass and no thyromegaly present.  Cardiovascular: Normal rate, regular rhythm, S1 normal, S2 normal, normal heart sounds, intact distal pulses and normal pulses.  Exam reveals no gallop and no friction rub.   No murmur heard. Pulmonary/Chest: Effort normal and breath sounds normal. No tachypnea. No respiratory distress. She has no decreased breath sounds. She has no wheezes. She has no rhonchi. She has no rales.  Abdominal: Soft. Normal appearance and bowel sounds are normal. There is no tenderness.  Mild pressure to palpation suprapubically and in RLQ  Neurological: She is alert.  Skin: Skin is warm, dry and intact. No rash noted.  Psychiatric: Her speech is normal and behavior is normal. Judgment and thought content normal. Her mood appears not anxious. Cognition and memory are normal. She does not exhibit a depressed mood.          Assessment & Plan:

## 2016-08-19 NOTE — Assessment & Plan Note (Signed)
Likely bladder spasm... No clear UTI.  Send for culture to make sure no infection.  No vaginal or urethra symptoms.  Most likely bladder irritation.. Stop bladder irritants and push fluids.

## 2016-08-19 NOTE — Patient Instructions (Signed)
No sign of current urinary infection. We will call with culture results.  Push water and stop spicy foods, citris, tomato, spicy foods etc. For possible bladder irritation.

## 2016-08-22 ENCOUNTER — Telehealth: Payer: Self-pay | Admitting: Family Medicine

## 2016-08-22 LAB — URINE CULTURE

## 2016-08-22 MED ORDER — NITROFURANTOIN MONOHYD MACRO 100 MG PO CAPS: 100.0000 mg | ORAL_CAPSULE | Freq: Two times a day (BID) | ORAL | 0 refills | Status: DC

## 2016-08-22 MED ORDER — FLUCONAZOLE 150 MG PO TABS: 150.0000 mg | ORAL_TABLET | Freq: Once | ORAL | 0 refills | Status: AC

## 2016-08-22 NOTE — Telephone Encounter (Signed)
Discussed slightly positive results with pt.. Given typical symptoms and pathogenic bacteria found... Concerning for partially treated UTI. Sent in Rx for nitrofurantoin as cipro recently used and not fully effective, SE to amox and sulfa per pt in past.

## 2016-08-23 ENCOUNTER — Encounter: Payer: Self-pay | Admitting: Otolaryngology

## 2016-08-23 LAB — CYTOLOGY - NON PAP

## 2016-08-25 ENCOUNTER — Encounter: Payer: Self-pay | Admitting: Otolaryngology

## 2016-08-25 ENCOUNTER — Other Ambulatory Visit: Payer: Self-pay | Admitting: Otolaryngology

## 2016-08-25 DIAGNOSIS — E041 Nontoxic single thyroid nodule: Secondary | ICD-10-CM

## 2016-11-15 ENCOUNTER — Ambulatory Visit
Admission: RE | Admit: 2016-11-15 | Discharge: 2016-11-15 | Disposition: A | Discharge status: Home / Self Care | Payer: BLUE CROSS/BLUE SHIELD | Source: Ambulatory Visit | Attending: Otolaryngology | Admitting: Otolaryngology

## 2016-11-15 DIAGNOSIS — E041 Nontoxic single thyroid nodule: Secondary | ICD-10-CM

## 2016-11-30 ENCOUNTER — Other Ambulatory Visit: Payer: Self-pay | Admitting: Otolaryngology

## 2016-11-30 DIAGNOSIS — E041 Nontoxic single thyroid nodule: Secondary | ICD-10-CM

## 2016-12-30 ENCOUNTER — Ambulatory Visit (INDEPENDENT_AMBULATORY_CARE_PROVIDER_SITE_OTHER): Payer: 59

## 2016-12-30 DIAGNOSIS — Z23 Encounter for immunization: Secondary | ICD-10-CM

## 2017-03-06 ENCOUNTER — Ambulatory Visit: Payer: No Typology Code available for payment source | Admitting: Internal Medicine

## 2017-03-08 ENCOUNTER — Ambulatory Visit: Payer: No Typology Code available for payment source | Admitting: Family Medicine

## 2017-03-08 ENCOUNTER — Encounter: Payer: Self-pay | Admitting: Family Medicine

## 2017-03-08 VITALS — BP 126/60 | HR 68 | Temp 97.9°F | Wt 222.5 lb

## 2017-03-08 DIAGNOSIS — J22 Unspecified acute lower respiratory infection: Secondary | ICD-10-CM

## 2017-03-08 MED ORDER — HYDROCODONE-HOMATROPINE 5-1.5 MG/5ML PO SYRP: 5.0000 mL | ORAL_SOLUTION | Freq: Three times a day (TID) | ORAL | 0 refills | Status: DC | PRN

## 2017-03-08 MED ORDER — AZITHROMYCIN 250 MG PO TABS: ORAL_TABLET | ORAL | 0 refills | Status: DC

## 2017-03-08 NOTE — Progress Notes (Signed)
Subjective:    Patient ID: Ariana Lopez, female    DOB: 1955/06/14, 61 y.o.   MRN: [id number]  HPI This is a 61 yo female who presents with loss of voice x 3 weeks. Started with loss of voice, felt otherwise well. Developed night time cough, with thick mucus. Mucus is grey to green. No nasal drainage or post nasal drainage, no ear pain. Feels hot with coughing. Wheezing at night, SOB with cough. Feels more congested now. Has been taking acetaminophen and benadryl. No history of asthma, but has always been a cougher.  Some fatigue from night time cough. No acid indigestion/reflux.   Past Medical History:  Diagnosis Date  . Breast cancer, left ([place]) 2005   Lumpectomy and sentinel lymph nodes resected, chemo + rad tx's.    Past Surgical History:  Procedure Laterality Date  . BREAST SURGERY    . COLONOSCOPY WITH PROPOFOL N/A 06/10/2016   Procedure: COLONOSCOPY WITH PROPOFOL;  Surgeon: Jonathon Bellows, MD;  Location: [facility] ENDOSCOPY;  Service: Endoscopy;  Laterality: N/A;  . left breast lumpectomy Left 2002   Family History  Problem Relation Age of Onset  . Cancer Sister 97       breast   Social History   Tobacco Use  . Smoking status: Former Research scientist (life sciences)  . Smokeless tobacco: Never Used  Substance Use Topics  . Alcohol use: Yes    Comment: occ  . Drug use: No      Review of Systems Per HPI    Objective:   Physical Exam  Constitutional: She is oriented to person, place, and time. She appears well-developed and well-nourished. No distress.  HENT:  Head: Normocephalic and atraumatic.  Right Ear: Tympanic membrane, external ear and ear canal normal.  Left Ear: Tympanic membrane, external ear and ear canal normal.  Nose: Nose normal.  Mouth/Throat: Oropharynx is clear and moist. No oropharyngeal exudate.  Hoarse voice.   Eyes: Conjunctivae are normal.  Cardiovascular: Normal rate, regular rhythm and normal heart sounds.  Pulmonary/Chest: Effort normal and breath sounds normal. No  respiratory distress. She has no wheezes. She has no rales.  Neurological: She is alert and oriented to person, place, and time.  Skin: Skin is warm and dry. She is not diaphoretic.  Psychiatric: She has a normal mood and affect. Her behavior is normal. Judgment and thought content normal.  Vitals reviewed.     BP 126/60 (BP Location: Right Arm, Patient Position: Sitting, Cuff Size: Normal)   Pulse 68   Temp 97.9 F (36.6 C) (Oral)   Wt 222 lb 8 oz (100.9 kg)   SpO2 98%   BMI 34.85 kg/m  Wt Readings from Last 3 Encounters:  03/08/17 222 lb 8 oz (100.9 kg)  08/19/16 222 lb 12 oz (101 kg)  08/08/16 223 lb (101.2 kg)       Assessment & Plan:  1. Lower respiratory tract infection -  Patient Instructions  For nasal congestion you can use Afrin nasal spray for 3 days max, saline nasal spray (generic is fine for all). Drink enough fluids to make your urine light yellow. For fever/chill/muscle aches you can take over the counter acetaminophen or ibuprofen.  Please come back in if you are not better in 5-7 days or if you develop wheezing, shortness of breath or persistent vomiting.     - azithromycin (ZITHROMAX) 250 MG tablet; Take 2 tabs PO x 1 dose, then 1 tab PO QD x 4 days  Dispense: 6 tablet; Refill: 0 -  HYDROcodone-homatropine (HYCODAN) 5-1.5 MG/5ML syrup; Take 5 mLs by mouth every 8 (eight) hours as needed for cough.  Dispense: 120 mL; Refill: 0 - if laryngitis does not resolve with antibiotic- refer to ENT  Clarene Reamer, FNP-BC  [facility], [place]  03/08/2017 11:51 AM

## 2017-03-08 NOTE — Patient Instructions (Signed)
For nasal congestion you can use Afrin nasal spray for 3 days max, saline nasal spray (generic is fine for all). Drink enough fluids to make your urine light yellow. For fever/chill/muscle aches you can take over the counter acetaminophen or ibuprofen.  Please come back in if you are not better in 5-7 days or if you develop wheezing, shortness of breath or persistent vomiting.   

## 2017-04-28 IMAGING — CT CT NECK W/ CM
2 of 3 series · 8 of 14 positions shown, 9 images · IV contrast (iopamidol)
Comparison: None.

CLINICAL DATA: Right ear tinnitus and right ear pain radiating down
the right neck since 06/03/2016. History of left-sided breast cancer
with lumpectomy.

EXAM:
CT NECK WITH CONTRAST
TECHNIQUE: Multidetector CT imaging of the neck was performed using the
standard protocol following the bolus administration of intravenous
contrast.
CONTRAST:  75mL [id number] IOPAMIDOL (M4SG4I-T11) INJECTION 61%

[Series 2: axial neck · axial · 0.53mm/px · z∈[-206,-52]mm · 4 of 129 slices shown]
[im 26/129  bone]
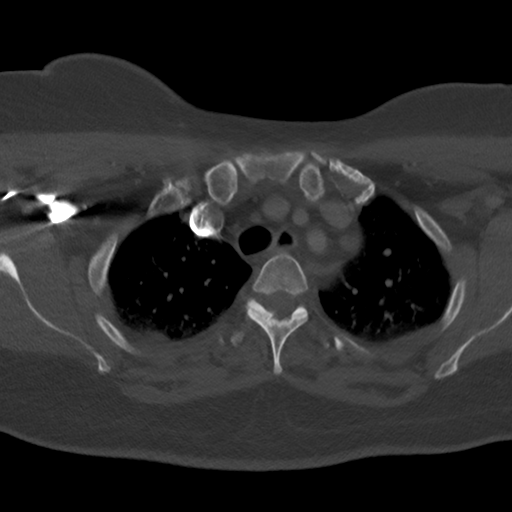
[im 52/129  bone]
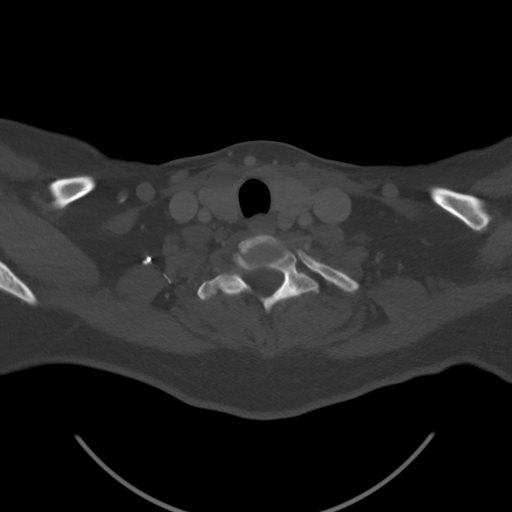
[im 77/129  bone]
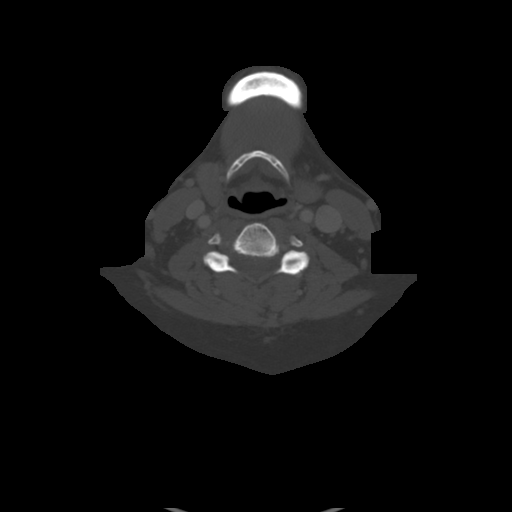
[im 103/129  bone]
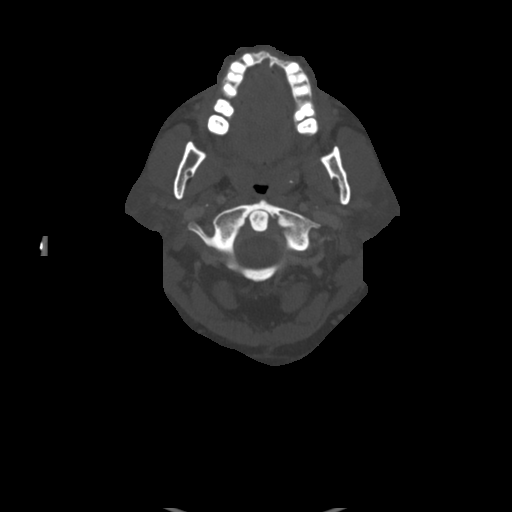

[Series 7: orthogonal ax · axial · 0.46mm/px · z∈[-217,-53]mm · 4 of 138 slices shown, 5 images]
[im 28/138  soft-tissue]
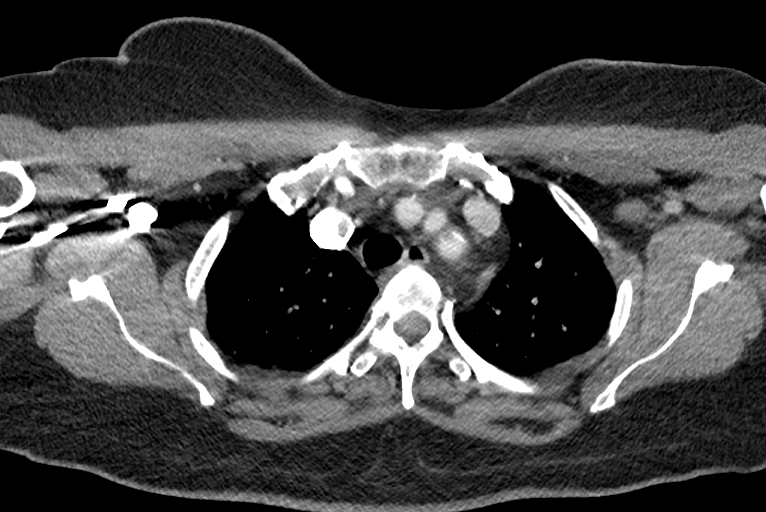
[im 28/138  bone]
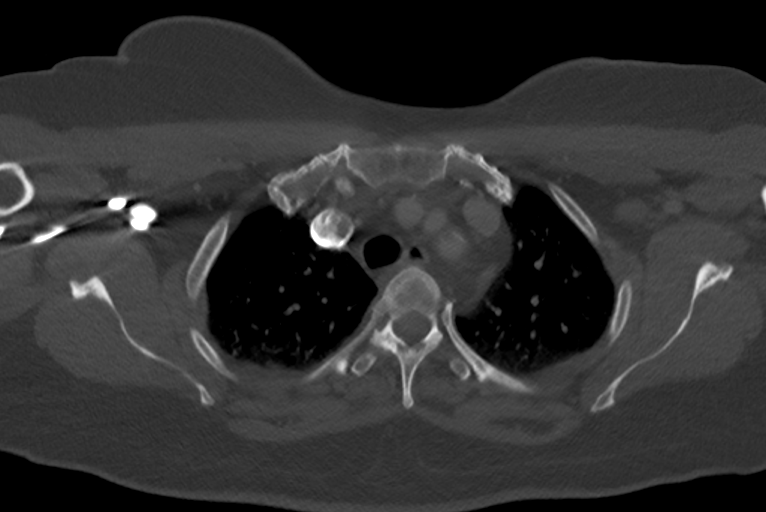
[im 55/138  bone]
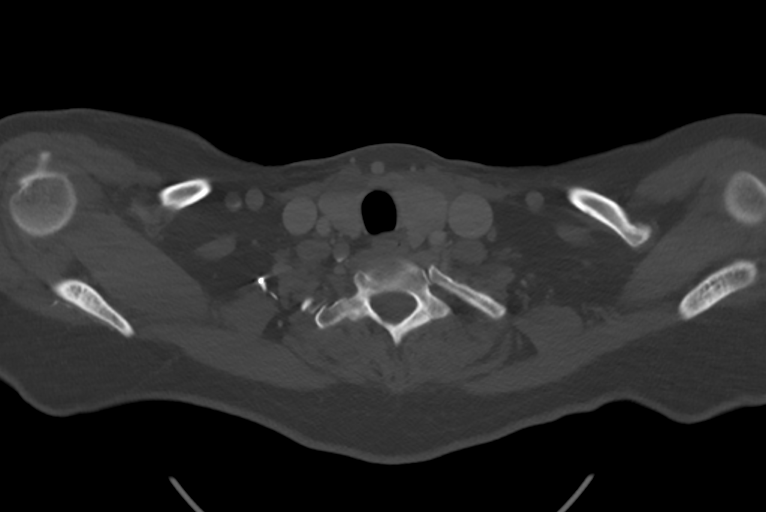
[im 83/138  bone]
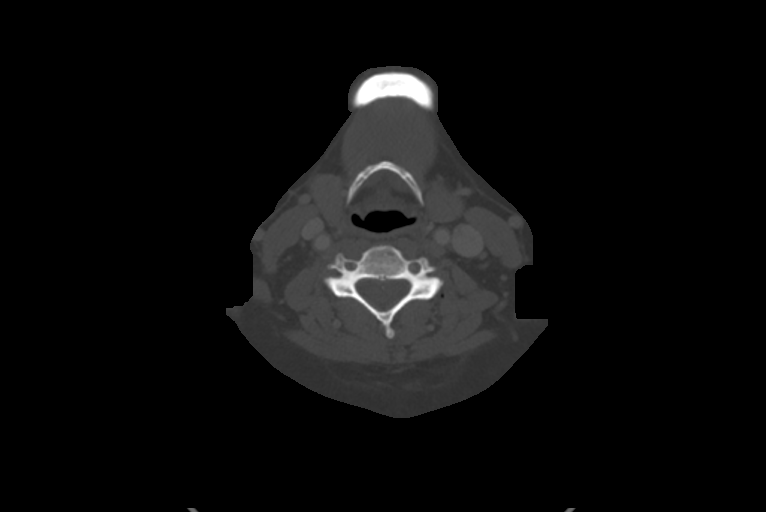
[im 110/138  bone]
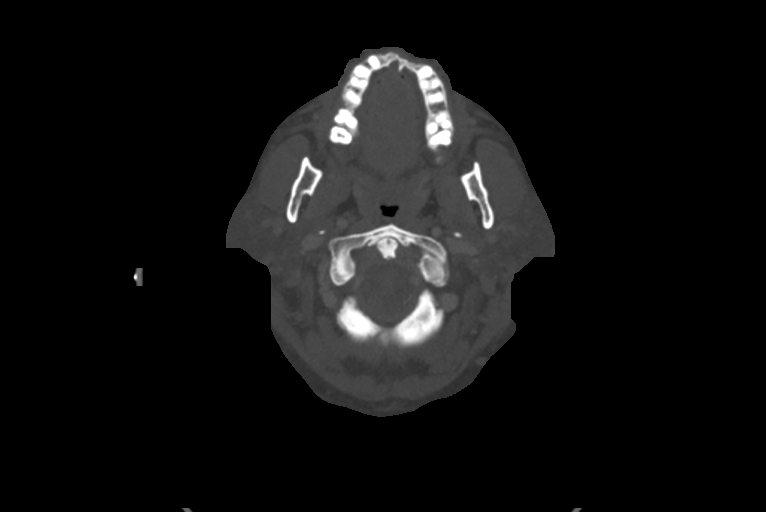

[8 of 14 positions shown; findings below may reference images not displayed]

FINDINGS: Pharynx and larynx: No mass or swelling. Tonsilliths. Very limited
evaluation of the oral cavity due to dental amalgam.

Salivary glands: Negative. In particular the right parotid shows no
inflammation or mass.

Thyroid: 
3 cm (craniocaudal) solid nodule in the lower left
thyroid gland.

Lymph nodes: None enlarged or abnormal density.

Vascular: No acute finding or notable atherosclerotic change.
Negative dural venous sinus and carotid at the right skullbase.

Limited intracranial: Negative

Visualized orbits: Negative

Mastoids and visualized paranasal sinuses: Probable small mucous
retention cysts in the inferior right maxillary sinus. No sinusitis
or mastoiditis.

Skeleton: Thoracic scoliosis with mild compensatory curve in the
cervical spine. Cervical spine degeneration.

Upper chest: Negative

Other: No soft tissue inflammation or mass around the right ear.
IMPRESSION: 1. No explanation for symptoms.
2. 3 cm solid nodule in the left thyroid, recommend sonographic
follow-up.

## 2017-08-28 ENCOUNTER — Ambulatory Visit
Admission: RE | Admit: 2017-08-28 | Discharge: 2017-08-28 | Disposition: A | Discharge status: Home / Self Care | Payer: BLUE CROSS/BLUE SHIELD | Source: Ambulatory Visit | Attending: Otolaryngology | Admitting: Otolaryngology

## 2017-08-28 ENCOUNTER — Ambulatory Visit: Payer: 59

## 2017-08-28 DIAGNOSIS — E041 Nontoxic single thyroid nodule: Secondary | ICD-10-CM | POA: Diagnosis present

## 2017-08-28 DIAGNOSIS — E049 Nontoxic goiter, unspecified: Secondary | ICD-10-CM | POA: Diagnosis not present

## 2018-01-11 ENCOUNTER — Ambulatory Visit: Payer: Self-pay

## 2018-01-17 ENCOUNTER — Encounter: Payer: Self-pay | Admitting: *Deleted

## 2018-01-17 ENCOUNTER — Other Ambulatory Visit: Payer: Self-pay | Admitting: *Deleted

## 2018-01-18 ENCOUNTER — Ambulatory Visit: Payer: Self-pay

## 2019-06-07 ENCOUNTER — Ambulatory Visit: Payer: No Typology Code available for payment source | Admitting: Nurse Practitioner

## 2019-06-07 ENCOUNTER — Encounter: Payer: Self-pay | Admitting: Nurse Practitioner

## 2019-06-07 ENCOUNTER — Other Ambulatory Visit: Payer: Self-pay

## 2019-06-07 VITALS — BP 122/66 | HR 69 | Temp 97.8°F | Ht 67.0 in | Wt 208.2 lb

## 2019-06-07 DIAGNOSIS — R252 Cramp and spasm: Secondary | ICD-10-CM

## 2019-06-07 DIAGNOSIS — M25512 Pain in left shoulder: Secondary | ICD-10-CM | POA: Diagnosis not present

## 2019-06-07 DIAGNOSIS — Z8669 Personal history of other diseases of the nervous system and sense organs: Secondary | ICD-10-CM

## 2019-06-07 DIAGNOSIS — L299 Pruritus, unspecified: Secondary | ICD-10-CM

## 2019-06-07 DIAGNOSIS — R110 Nausea: Secondary | ICD-10-CM | POA: Diagnosis not present

## 2019-06-07 DIAGNOSIS — Z853 Personal history of malignant neoplasm of breast: Secondary | ICD-10-CM

## 2019-06-07 NOTE — Patient Instructions (Addendum)
It was great to meet you today.   Please go to the lab today.   I have placed an order for your mammogram  I have sent in a referral for DERMATOLOGY  Please seek ORTHO opinion at your [facility] office for on going left shoulder pain.   If H pylori is negative- I will recommend a 1 month  trial of Prilosec OTC 20 mg for nausea.   Please come back  in 4- 6 weeks for PAP test and to follow up on all of the above.

## 2019-06-07 NOTE — Progress Notes (Addendum)
New Patient Office Visit  Subjective:  Patient ID: Ariana Lopez, female    DOB: 21-Apr-1955  Age: 64 y.o. MRN: [id number]  CC:  Chief Complaint  Patient presents with  . Establish Care   HPI:  This 64 yo patient comes in to establish care with a new PCP since [name] is leaving.    She does have a concern with  itchy skin across her shoulders and down the middle of her back. She does not think it is dry skin and does not feel any bumps on the skin. This started over the last couple of months and bothers her the most at night. She can use a back scratcher and lotions but nothing gets rid of the itchy sensation. She has been lifting boxes at her [employer] job and thinks she may have over used her muscles or be experiencing a pinched nerve. She does get bilateral shoulder aching since Dec. She takes ibuprofen about x3 per week at night and it helps her sleep and alleviates the pain but doesn't not resolve it. The dry skin sensation and the and upper back pain started at the same time. She wears a back brace at work. Some lower back ache but left shoulder feels inflamed.  She gets a few minutes of nausea that comes and goes intermittent for years . Cheese or cold cuts, may bring it on or cause her to belch.  It is getting worse-maybe secondary to husband being home all of the time. She eats fast at work and thought she maybe getting reflux. It is relieved with peppermint. She thinks dairy makes it worse and has cut back. No history of H pylori, dysphagia. No abdominal pain.     FH: colonoscopy 2018 and was normal.  Mammo-not in a long time PAP- unknown Vision: yes recent needs glasses Dental: needs appt Vaccine: Trying to get Covid Flu shot  PNA- vaccine   Past Medical History:  Diagnosis Date  . Allergy   . Breast cancer, left ([place]) 2005   Lumpectomy and sentinel lymph nodes resected, chemo + rad tx's.     Past Surgical History:  Procedure Laterality Date  . BREAST SURGERY     . CESAREAN SECTION  2000  . COLONOSCOPY WITH PROPOFOL N/A 06/10/2016   Procedure: COLONOSCOPY WITH PROPOFOL;  Surgeon: Jonathon Bellows, MD;  Location: [facility] ENDOSCOPY;  Service: Endoscopy;  Laterality: N/A;  . left breast lumpectomy Left 2002    Family History  Problem Relation Age of Onset  . Cancer Sister 4       breast  . Stroke Mother   . Heart disease Father   . Hypertension Father   . Heart attack Brother     Social History   Socioeconomic History  . Marital status: Married    Spouse name: Not on file  . Number of children: Not on file  . Years of education: Not on file  . Highest education level: Not on file  Occupational History  . Not on file  Tobacco Use  . Smoking status: Former Research scientist (life sciences)  . Smokeless tobacco: Never Used  Substance and Sexual Activity  . Alcohol use: Yes    Comment: occ  . Drug use: No  . Sexual activity: Not Currently  Other Topics Concern  . Not on file  Social History Narrative   G1P1   Married.   Recently moved here from [place].            Social Determinants  of Health   Financial Resource Strain:   . Difficulty of Paying Living Expenses:   Food Insecurity:   . Worried About Charity fundraiser in the Last Year:   . Arboriculturist in the Last Year:   Transportation Needs:   . Film/video editor (Medical):   Marland Kitchen Lack of Transportation (Non-Medical):   Physical Activity:   . Days of Exercise per Week:   . Minutes of Exercise per Session:   Stress:   . Feeling of Stress :   Social Connections:   . Frequency of Communication with Friends and Family:   . Frequency of Social Gatherings with Friends and Family:   . Attends Religious Services:   . Active Member of Clubs or Organizations:   . Attends Archivist Meetings:   Marland Kitchen Marital Status:   Intimate Partner Violence:   . Fear of Current or Ex-Partner:   . Emotionally Abused:   Marland Kitchen Physically Abused:   . Sexually Abused:     Review of Systems  Constitutional:  Negative.   HENT: Negative.   Respiratory: Negative for cough, chest tightness and shortness of breath.   Cardiovascular: Negative.  Negative for chest pain and leg swelling.  Gastrointestinal: Negative for abdominal pain, blood in stool, constipation, diarrhea, nausea and vomiting.  Endocrine: Negative for cold intolerance, heat intolerance, polydipsia and polyuria.  Genitourinary: Negative for decreased urine volume, difficulty urinating, dysuria and urgency.  Musculoskeletal: Negative for arthralgias and back pain.       She also gets cramps in her arms and legs when bends her limbs in certain positions.   Neurological: Negative.   Hematological: Negative for adenopathy. Bruises/bleeds easily.  Psychiatric/Behavioral: Negative.        No concerns about anxiety/depression.    Objective:   Today's Vitals: BP 122/66   Pulse 69   Temp 97.8 F (36.6 C) (Skin)   Ht '5\' 7"'  (1.702 m)   Wt 208 lb 3.2 oz (94.4 kg)   SpO2 96%   BMI 32.61 kg/m   Physical Exam Vitals reviewed.  Constitutional:      Appearance: Normal appearance.  HENT:     Head: Normocephalic.  Cardiovascular:     Rate and Rhythm: Normal rate and regular rhythm.     Pulses: Normal pulses.     Heart sounds: Normal heart sounds.  Pulmonary:     Effort: Pulmonary effort is normal.     Breath sounds: Normal breath sounds.  Abdominal:     General: Bowel sounds are normal.     Palpations: Abdomen is soft.     Tenderness: There is no abdominal tenderness.  Musculoskeletal:     Cervical back: Normal range of motion.     Comments: Some decreed ROM left shoulder and post scapula muscle tenderness. Skin intact. Decreased reflexes bilat.   Skin:    General: Skin is warm and dry.     Comments: Large moles over left breast, no dry skin or rash post back where she has the sensation of constant itch.   Neurological:     General: No focal deficit present.     Mental Status: She is alert.  Psychiatric:     Comments: Flat  affect.    Breast exam: Normal appearance., no masses, tenderness, nipple retraction or dimpling. No axillary or supraclavicular adenopathy.   Assessment & Plan:   Problem List Items Addressed This Visit      Other   History of breast cancer  Relevant Orders   MM 3D SCREEN BREAST BILATERAL   History of tinnitus    Other Visit Diagnoses    Nausea    -  Primary   Relevant Orders   H. pylori breath test   CBC   Comp Met (CMET)   HgB A1c   Lipid Profile   TSH   Left shoulder pain, unspecified chronicity       Cramps, muscle, general       Relevant Orders   Magnesium   Vitamin D (25 hydroxy)   Itchy skin       Relevant Orders   Ambulatory referral to Dermatology      Outpatient Encounter Medications as of 06/07/2019  Medication Sig  . aspirin EC 81 MG tablet Take 81 mg by mouth daily.  Marland Kitchen azelastine (ASTELIN) 0.1 % nasal spray Place into the nose.  . fluticasone (FLONASE) 50 MCG/ACT nasal spray PLACE 2 SPRAYS INTO BOTH NOSTRILS DAILY.  . influenza vaccine (FLUCELVAX QUADRIVALENT) 0.5 ML injection Flucelvax Quad 2019-2020 (PF) 60 mcg (15 mcg x 4)/0.5 mL IM syringe  TO BE ADMINISTERED BY PHARMACIST FOR IMMUNIZATION  . pneumococcal 23 valent vaccine (PNEUMOVAX 23) 25 MCG/0.5ML injection Pneumovax-23 25 mcg/0.5 mL injection syringe  TO BE ADMINISTERED BY PHARMACIST FOR IMMUNIZATION  . TROKENDI XR 50 MG CP24 Take 50 mg by mouth daily.  . simvastatin (ZOCOR) 20 MG tablet Take 20 mg by mouth daily.  . [DISCONTINUED] HYDROcodone-homatropine (HYCODAN) 5-1.5 MG/5ML syrup Take 5 mLs by mouth every 8 (eight) hours as needed for cough.  . [DISCONTINUED] mometasone (ELOCON) 0.1 % lotion USE 4 DROPS IN RIGHT EAR CANAL AT NIGHT AS NEEDED FOR ITCHING AND DRY SKIN  . [DISCONTINUED] nitrofurantoin, macrocrystal-monohydrate, (MACROBID) 100 MG capsule Take 1 capsule (100 mg total) by mouth 2 (two) times daily.  . [DISCONTINUED] triamcinolone (KENALOG) 0.025 % cream 3 (three) times daily.   No  facility-administered encounter medications on file as of 06/07/2019.   Please go to the lab today.   I have placed an order for your mammogram  I have sent in a referral for DERMATOLOGY  Please seek ORTHO opinion at your [facility] office for on going left shoulder pain.   If H pylori is negative- I will recommend a 1 month  trial of Prilosec OTC 20 mg for nausea.   Please come back in 4- 6 weeks for PAP test and to follow up on all of the above.  Follow-up: Return in about 6 weeks (around 07/19/2019) for For CPE/pap.   Denice Paradise, NP

## 2019-06-09 ENCOUNTER — Encounter: Payer: Self-pay | Admitting: Nurse Practitioner

## 2019-06-09 DIAGNOSIS — Z8669 Personal history of other diseases of the nervous system and sense organs: Secondary | ICD-10-CM | POA: Insufficient documentation

## 2019-06-10 LAB — H. PYLORI BREATH TEST: H. pylori Breath Test: NOT DETECTED

## 2019-06-12 ENCOUNTER — Telehealth: Payer: Self-pay | Admitting: Nurse Practitioner

## 2019-06-12 DIAGNOSIS — E785 Hyperlipidemia, unspecified: Secondary | ICD-10-CM

## 2019-06-12 DIAGNOSIS — R252 Cramp and spasm: Secondary | ICD-10-CM

## 2019-06-12 NOTE — Telephone Encounter (Signed)
Please call her and advise:  I discussed her case with Dr. Nicki Reaper. She does have chronic pain and is reporting cramps in extremities. Labs pending.  Plan:  Try stopping the LIPITOR for 2 weeks and see if that helps the cramps.   Call her back in 2 weeks for a report. If no help, we will start the Lipitor back again.   If the cramps resolve, I will consider switching her statin.

## 2019-06-13 ENCOUNTER — Telehealth: Payer: Self-pay | Admitting: Nurse Practitioner

## 2019-06-13 ENCOUNTER — Other Ambulatory Visit: Payer: No Typology Code available for payment source

## 2019-06-13 ENCOUNTER — Other Ambulatory Visit: Payer: Self-pay

## 2019-06-13 DIAGNOSIS — E785 Hyperlipidemia, unspecified: Secondary | ICD-10-CM

## 2019-06-13 DIAGNOSIS — R252 Cramp and spasm: Secondary | ICD-10-CM

## 2019-06-13 DIAGNOSIS — R110 Nausea: Secondary | ICD-10-CM

## 2019-06-13 DIAGNOSIS — Z853 Personal history of malignant neoplasm of breast: Secondary | ICD-10-CM

## 2019-06-13 DIAGNOSIS — Z8639 Personal history of other endocrine, nutritional and metabolic disease: Secondary | ICD-10-CM

## 2019-06-13 DIAGNOSIS — R7989 Other specified abnormal findings of blood chemistry: Secondary | ICD-10-CM

## 2019-06-13 LAB — LIPID PANEL
Cholesterol: 152 mg/dL (ref 0–200)
HDL: 59.3 mg/dL (ref >39.00)
LDL Cholesterol: 82 mg/dL (ref 0–99)
NonHDL: 93.05
Total CHOL/HDL Ratio: 3
Triglycerides: 53 mg/dL (ref 0.0–149.0)
VLDL: 10.6 mg/dL (ref 0.0–40.0)

## 2019-06-13 LAB — VITAMIN D 25 HYDROXY (VIT D DEFICIENCY, FRACTURES): VITD: 18.91 ng/mL — ABNORMAL LOW (ref 30.00–100.00)

## 2019-06-13 LAB — HEMOGLOBIN A1C: Hgb A1c MFr Bld: 4.9 % (ref 4.6–6.5)

## 2019-06-13 LAB — COMPREHENSIVE METABOLIC PANEL
ALT: 12 U/L (ref 0–35)
AST: 12 U/L (ref 0–37)
Albumin: 3.8 g/dL (ref 3.5–5.2)
Alkaline Phosphatase: 114 U/L (ref 39–117)
BUN: 15 mg/dL (ref 6–23)
CO2: 23 mEq/L (ref 19–32)
Calcium: 9.1 mg/dL (ref 8.4–10.5)
Chloride: 112 mEq/L (ref 96–112)
Creatinine, Ser: 0.74 mg/dL (ref 0.40–1.20)
GFR: 79.01 mL/min (ref >60.00)
Glucose, Bld: 98 mg/dL (ref 70–99)
Potassium: 4.1 mEq/L (ref 3.5–5.1)
Sodium: 141 mEq/L (ref 135–145)
Total Bilirubin: 0.7 mg/dL (ref 0.2–1.2)
Total Protein: 6.6 g/dL (ref 6.0–8.3)

## 2019-06-13 LAB — TSH: TSH: 0.96 u[IU]/mL (ref 0.35–4.50)

## 2019-06-13 LAB — CBC
HCT: 39.3 % (ref 36.0–46.0)
Hemoglobin: 12.9 g/dL (ref 12.0–15.0)
MCHC: 32.8 g/dL (ref 30.0–36.0)
MCV: 96.3 fl (ref 78.0–100.0)
Platelets: 215 10*3/uL (ref 150.0–400.0)
RBC: 4.09 Mil/uL (ref 3.87–5.11)
RDW: 12.2 % (ref 11.5–15.5)
WBC: 5.2 10*3/uL (ref 4.0–10.5)

## 2019-06-13 LAB — MAGNESIUM: Magnesium: 1.9 mg/dL (ref 1.5–2.5)

## 2019-06-13 LAB — CK: Total CK: 99 U/L (ref 7–177)

## 2019-06-13 NOTE — Telephone Encounter (Signed)
Correction noted:  She was advised to hold  her ZOCOR- simvastatin for only 2 weeks to see if it helped her cramps. Call back to give results.

## 2019-06-13 NOTE — Telephone Encounter (Signed)
Patient was agreeable to trial stop of simvastatin. She has not been in Lipitor. She will let us know if this does help her cramping.    Advised that labs were not back & we would call when they were.

## 2019-06-13 NOTE — Telephone Encounter (Signed)
Please notify her that her labs look very good except for a low Vit D.   PLAN: 1. Recommend ENDOCRINOLOGY referral for low Vit D, need for DEXA scan, and Hx of a thyroid nodule previously evaluated by Dr. Pryor Ochoa in 2018 and 2019. Her recent TSH is WNL.   2. She is holding her simvastatin for 2 weeks only to see if her cramps improve. If no change, let me know and re start the simvastatin as her lipid panel is good and very important to reduce her cardiovascular risk.

## 2019-06-18 NOTE — Telephone Encounter (Signed)
Pt called returning you call said she will call back tomorrow

## 2019-06-18 NOTE — Telephone Encounter (Signed)
Left message for patient to return call to office. 

## 2019-07-19 ENCOUNTER — Encounter: Payer: Self-pay | Admitting: Nurse Practitioner

## 2019-07-19 ENCOUNTER — Other Ambulatory Visit: Payer: Self-pay

## 2019-07-19 ENCOUNTER — Ambulatory Visit: Payer: No Typology Code available for payment source | Admitting: Nurse Practitioner

## 2019-07-19 ENCOUNTER — Other Ambulatory Visit (HOSPITAL_COMMUNITY)
Admission: RE | Admit: 2019-07-19 | Discharge: 2019-07-19 | Disposition: A | Discharge status: Home / Self Care | Payer: BLUE CROSS/BLUE SHIELD | Source: Ambulatory Visit | Attending: Nurse Practitioner | Admitting: Nurse Practitioner

## 2019-07-19 VITALS — BP 120/70 | HR 54 | Temp 97.3°F | Ht 68.0 in | Wt 210.0 lb

## 2019-07-19 DIAGNOSIS — L299 Pruritus, unspecified: Secondary | ICD-10-CM

## 2019-07-19 DIAGNOSIS — E041 Nontoxic single thyroid nodule: Secondary | ICD-10-CM

## 2019-07-19 DIAGNOSIS — I6523 Occlusion and stenosis of bilateral carotid arteries: Secondary | ICD-10-CM

## 2019-07-19 DIAGNOSIS — Z0001 Encounter for general adult medical examination with abnormal findings: Secondary | ICD-10-CM

## 2019-07-19 DIAGNOSIS — G4733 Obstructive sleep apnea (adult) (pediatric): Secondary | ICD-10-CM

## 2019-07-19 DIAGNOSIS — N362 Urethral caruncle: Secondary | ICD-10-CM

## 2019-07-19 DIAGNOSIS — Z0000 Encounter for general adult medical examination without abnormal findings: Secondary | ICD-10-CM | POA: Insufficient documentation

## 2019-07-19 DIAGNOSIS — J309 Allergic rhinitis, unspecified: Secondary | ICD-10-CM

## 2019-07-19 DIAGNOSIS — E669 Obesity, unspecified: Secondary | ICD-10-CM

## 2019-07-19 DIAGNOSIS — Z853 Personal history of malignant neoplasm of breast: Secondary | ICD-10-CM

## 2019-07-19 DIAGNOSIS — R7989 Other specified abnormal findings of blood chemistry: Secondary | ICD-10-CM

## 2019-07-19 DIAGNOSIS — Z8669 Personal history of other diseases of the nervous system and sense organs: Secondary | ICD-10-CM

## 2019-07-19 NOTE — Progress Notes (Signed)
Established Patient Office Visit  Subjective:  Patient ID: Ariana Lopez, female    DOB: 09/02/1955  Age: 64 y.o. MRN: [id number]  CC:  Chief Complaint  Patient presents with   Annual Exam    HPI Chardonnae Grapes is a 64 year old patient with history of left breast cancer 2005, sleep apnea, hyperlipidemia/intracranial atherosclerosis, pulsatile tinnitus, thyroid nodule, itchy back without rash and intermittent bilateral shoulder aches presents for preventative healthcare visit.  HLD: Intracranial atherosclerosis per MRI imaging  in 2017 for pulsatile tinnitus, placed on statin and ASA 81 mg .  Recent leg cramps and she held the simvastatin for  2 weeks to see if it helped her cramps. It did and she has felt much better off the statin. CK was normal. She will ned to restart a statin for and consider Crestor which is more hydrophilic and has less muscle SE.   Pulsatile tinnitus right ear/tension type migraines: Followed by Dr. Manuella Ghazi in Neurology and on Trokendi XR 50 mg daily. Saw him last 02/2019. 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 He mentions associated cramps in her feet.   Sleep Apnea: Not using CPAP  Thyroid nodule/ Low Vit D/need Dexa: Start 100 IU Vit D 3 and ENDO referral.  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 Not  bothering her as much now. Takes occasional ibuprofen.   Pruritus  back: No visible rash. Question neuropathic.  Derm consult in place 08/19/19.  Health Prevention:  FH: colonoscopy 2018 and was normal.  Mammo-not in a long time PAP- unknown Vision: yes recent needs glasses Dental: needs appt Vaccine:  [place] 06/21/19 and 07/12/2019 Flu shot  PNA- Pneumovax 02/14/2018, Prevnar 13-N/D TDAP 05/19/2016   Past Medical History:  Diagnosis Date    Allergy    Breast cancer, left ([place]) 2005   Lumpectomy and sentinel lymph nodes resected, chemo + rad tx's.    History of thyroid nodule    History of tinnitus    Low vitamin D level 07/21/2019   Obesity (BMI 30.0-34.9) 11/13/2012   Sleep apnea    Thyroid nodule 07/21/2019    Past Surgical History:  Procedure Laterality Date   BREAST SURGERY     CESAREAN SECTION  2000   COLONOSCOPY WITH PROPOFOL N/A 06/10/2016   Procedure: COLONOSCOPY WITH PROPOFOL;  Surgeon: Jonathon Bellows, MD;  Location: [facility] ENDOSCOPY;  Service: Endoscopy;  Laterality: N/A;   left breast lumpectomy Left 2002    Family History  Problem Relation Age of Onset   Cancer Sister 83       breast   Stroke Mother    Heart disease Father    Hypertension Father    Heart attack Brother     Social History   Socioeconomic History   Marital status: Married    Spouse name: Not on file   Number of children: Not on file   Years of education: Not on file   Highest education level: Not on file  Occupational History   Not on file  Tobacco Use   Smoking status: Former Smoker   Smokeless tobacco: Never Used  Substance and Sexual Activity   Alcohol use: Yes    Comment: occ   Drug use: No   Sexual activity: Not Currently  Other Topics Concern   Not on file  Social History Narrative   G1P1   Married.   Recently moved here from [place].            Social Determinants of Health   Financial Resource Strain:    Difficulty of Paying Living Expenses:   Food Insecurity:    Worried About Charity fundraiser in the Last Year:    Arboriculturist in the Last Year:   Transportation Needs:    Film/video editor (Medical):    Lack of Transportation (Non-Medical):   Physical Activity:    Days of Exercise per Week:    Minutes of Exercise per Session:   Stress:    Feeling of Stress :   Social Connections:    Frequency of Communication with Friends and Family:    Frequency of Social  Gatherings with Friends and Family:    Attends Religious Services:    Active Member of Clubs or Organizations:    Attends Music therapist:    Marital Status:   Intimate Partner Violence:    Fear of Current or Ex-Partner:    Emotionally Abused:    Physically Abused:    Sexually Abused:     Outpatient Medications Prior to Visit  Medication Sig Dispense Refill   aspirin EC 81 MG tablet Take 81 mg by mouth daily.     azelastine (ASTELIN) 0.1 % nasal spray Place into the nose.     fluticasone (FLONASE) 50 MCG/ACT nasal spray PLACE 2 SPRAYS INTO BOTH NOSTRILS DAILY. 16 g 11   TROKENDI XR 50 MG CP24 Take 50 mg by mouth daily.     influenza vaccine (FLUCELVAX QUADRIVALENT) 0.5 ML injection Flucelvax Quad 2019-2020 (PF) 60 mcg (15 mcg x 4)/0.5 mL IM syringe  TO BE ADMINISTERED BY PHARMACIST FOR IMMUNIZATION     pneumococcal 23 valent vaccine (PNEUMOVAX 23) 25 MCG/0.5ML injection Pneumovax-23 25 mcg/0.5 mL injection syringe  TO BE ADMINISTERED BY PHARMACIST FOR IMMUNIZATION     simvastatin (ZOCOR) 20 MG tablet Take 20 mg by mouth daily.     No facility-administered medications prior to visit.    Allergies  Allergen Reactions   Augmentin [Amoxicillin-Pot Clavulanate]     Insomnia/headache /nausea. Able to take PCN and Amox.   Gabapentin Nausea Only     Review of Systems  Psychiatric/Behavioral:       No concerns about depression/anxiety.   Pertinent positives as noted in history of present illness otherwise negative. Nausea: H pylori neg rare nausea she thinks when eats cheese. Not problematic. No GERD.   Objective:    Physical Exam  Constitutional: She is oriented to person, place, and time. She appears well-developed and well-nourished.  HENT:  Head: Normocephalic and atraumatic.  Right Ear: External ear normal.  Left Ear: External ear normal.  Eyes: Pupils are equal, round, and reactive to light. Conjunctivae and EOM are normal.  Neck: No  tracheal deviation present. Thyromegaly present.  Thyroid nodule and enlarge thyroid on left.   Cardiovascular: Regular rhythm, normal heart sounds and intact distal pulses.  No murmur heard. Bradycardia-resting HR low 60s.   Pulmonary/Chest: Effort normal and breath sounds normal.  Abdominal: Soft. Bowel sounds are normal.  Genitourinary:    Vagina normal.     There is no rash or lesion on the right labia. There is no rash or lesion on the left labia.    Genitourinary Comments: Bimanual exam with small cervix, non tender and no palpable  masses.    Musculoskeletal:  Cervical back: Normal range of motion.  Neurological: She is alert and oriented to person, place, and time.  Skin: Skin is warm and dry. No rash noted.  Psychiatric: Her behavior is normal. Judgment and thought content normal.  Flat affect. Low energy: PHQ 3, GAD 0  Vitals reviewed.   BP 120/70    Pulse (!) 54    Temp (!) 97.3 F (36.3 C) (Temporal)    Ht 5\' 8"  (1.727 m)    Wt 210 lb (95.3 kg)    SpO2 96%    BMI 31.93 kg/m  Wt Readings from Last 3 Encounters:  07/19/19 210 lb (95.3 kg)  06/07/19 208 lb 3.2 oz (94.4 kg)  03/08/17 222 lb 8 oz (100.9 kg)     Health Maintenance Due  Topic Date Due   HIV Screening  Never done   MAMMOGRAM  09/11/2015   PAP SMEAR-Modifier  05/20/2019    There are no preventive care reminders to display for this patient.  Lab Results  Component Value Date   TSH 0.96 06/13/2019   Lab Results  Component Value Date   WBC 5.2 06/13/2019   HGB 12.9 06/13/2019   HCT 39.3 06/13/2019   MCV 96.3 06/13/2019   PLT 215.0 06/13/2019   Lab Results  Component Value Date   NA 141 06/13/2019   K 4.1 06/13/2019   CO2 23 06/13/2019   GLUCOSE 98 06/13/2019   BUN 15 06/13/2019   CREATININE 0.74 06/13/2019   BILITOT 0.7 06/13/2019   ALKPHOS 114 06/13/2019   AST 12 06/13/2019   ALT 12 06/13/2019   PROT 6.6 06/13/2019   ALBUMIN 3.8 06/13/2019   CALCIUM 9.1 06/13/2019   GFR  79.01 06/13/2019   Lab Results  Component Value Date   CHOL 152 06/13/2019   Lab Results  Component Value Date   HDL 59.30 06/13/2019   Lab Results  Component Value Date   LDLCALC 82 06/13/2019   Lab Results  Component Value Date   TRIG 53.0 06/13/2019   Lab Results  Component Value Date   CHOLHDL 3 06/13/2019   Lab Results  Component Value Date   HGBA1C 4.9 06/13/2019      Assessment & Plan:   Problem List Items Addressed This Visit      Cardiovascular and Mediastinum   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 Manuella Ghazi in Neurology 02/2019. Intracranial atherosclerosis per MRI imaging  in 2017 for pulsatile tinnitus, placed on statin and ASA 81 mg .  Recent leg cramps and she held the simvastatin for  2 weeks to see if it helped her cramps. It did and she has felt much better off the statin. CK was normal. She will need to restart a statin for and consider Crestor which is more hydrophilic and has less muscle SE.          Respiratory   Allergic rhinitis    Using nasal sprays Astelin, Flonase with good results - spring allergies.       OSA (obstructive sleep apnea)    Advised to use CPAP.         Endocrine   Thyroid nodule     Musculoskeletal and Integument   Pruritus    Pruritus  back: No visible rash. Question neuropathic.  Derm consult in place 08/19/19.        Genitourinary   Urethral caruncle     Other   History of breast cancer  Breast exam done last month. Mammo -ordered and she will need to set up the appt-given numbers.       Obesity (BMI 30.0-34.9)    Continue with healthy diet/exercise.      History of tinnitus    Followed by Dr. Manuella Ghazi in Neurology. Had Brain MRI  MRV of the intracranial venous circulation 2017. Intracranial atherosclerosis per MRI imaging  in 2017 for pulsatile tinnitus, placed on statin and ASA 81 mg.  Followed by Dr. Manuella Ghazi in Neurology and on Trokendi XR 50 mg daily. Saw him  last 02/2019. 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 He mentions associated cramps in her feet.       Low vitamin D level    Low Vit D: 18- placed on Vit D 3 1000 IU daily, ref to ENDOCRINOLOGY       Other Visit Diagnoses    Routine physical examination    -  Primary   Relevant Orders   Cytology - PAP( [place])    Presents for Well Woman Exam today:  I have placed an order for your mammogram. Patient given phone numbers to call and schedule her mammogram.   I have sent off a PAP test for you.    For your leg cramps: Normal CK. Need the statin for hx of  Intracranial atherosclerosis per MRI imaging  in 2017 for pulsatile tinnitus, placed on statin and ASA 81 mg . Per chart review: long hx of foot cramps. Consider Crestor.   You have a thyroid nodule on the left, low vitamin D, need to assess bone density scan, and please keep the endocrinology appointment as you have already arranged. In the meantime, you may take vitamin D3 1000 IU daily and can pick that up over-the-counter without a prescription.  I have ordered a 3D  screening mammogram for you. YOU MUST CALL AND SCHEDULE your own appt.  [place]  [place], [place]  Please seek ORTHO opinion at your [place] office for on going left shoulder pain.   Low Vit D: 18 Recommend ENDOCRINOLOGY referral for low Vit D, need for DEXA scan, and Hx of a thyroid nodule previously evaluated by Dr. Pryor Ochoa in 2018 and 2019. Her recent TSH is WNL.    Caruncle/Cystocele- estrogen recommended for caruncle,   but hx of breast cancer. Consider Urology referral.    No orders of the defined types were placed in this encounter.   Follow-up: Return in about 6 months (around 01/18/2020).    Denice Paradise, NP

## 2019-07-19 NOTE — Patient Instructions (Addendum)
Very nice to see you again today.  I have sent off a PAP test for you.    Your leg cramps improved when you have the Zocor, I will monitor your lipids- have been normal for many years.Contniue to eat a healthy diet.   You have a thyroid nodule on the left, low vitamin D, need to assess bone density scan, and please keep the endocrinology appointment as you have already arranged. In the meantime, you may take vitamin D3 1000 IU daily and pick that up over-the-counter without a prescription.  I have ordered a 3D  screening mammogram for you. YOU MUST CALL AND SCHEDULE your own appt.  Blythe  Harrison [place], [place]

## 2019-07-21 ENCOUNTER — Encounter: Payer: Self-pay | Admitting: Nurse Practitioner

## 2019-07-21 DIAGNOSIS — E041 Nontoxic single thyroid nodule: Secondary | ICD-10-CM

## 2019-07-21 DIAGNOSIS — R7989 Other specified abnormal findings of blood chemistry: Secondary | ICD-10-CM | POA: Insufficient documentation

## 2019-07-21 DIAGNOSIS — N362 Urethral caruncle: Secondary | ICD-10-CM | POA: Insufficient documentation

## 2019-07-21 HISTORY — DX: Other specified abnormal findings of blood chemistry: R79.89

## 2019-07-21 HISTORY — DX: Nontoxic single thyroid nodule: E04.1

## 2019-07-21 NOTE — Assessment & Plan Note (Signed)
Breast exam done last month. Mammo -ordered and she will need to set up the appt-given numbers.

## 2019-07-21 NOTE — Assessment & Plan Note (Signed)
Using nasal sprays Astelin, Flonase with good results - spring allergies.

## 2019-07-21 NOTE — Assessment & Plan Note (Signed)
Continue with healthy diet/exercise.

## 2019-07-21 NOTE — Assessment & Plan Note (Signed)
Pruritus  back: No visible rash. Question neuropathic.  Derm consult in place 08/19/19.

## 2019-07-21 NOTE — Assessment & Plan Note (Addendum)
Followed by Dr. Manuella Ghazi in Neurology. Had Brain MRI  MRV of the intracranial venous circulation 2017. Intracranial atherosclerosis per MRI imaging  in 2017 for pulsatile tinnitus, placed on statin and ASA 81 mg.  Followed by Dr. Manuella Ghazi in Neurology and on Trokendi XR 50 mg daily. Saw him last 02/2019. 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 He mentions associated cramps in her feet.

## 2019-07-21 NOTE — Assessment & Plan Note (Signed)
Advised to use CPAP.

## 2019-07-21 NOTE — Assessment & Plan Note (Addendum)
Low Vit D: 18- placed on Vit D 3 1000 IU daily, ref to ENDOCRINOLOGY

## 2019-07-21 NOTE — Assessment & Plan Note (Signed)
Followed by Dr. Manuella Ghazi in Neurology 02/2019. Intracranial atherosclerosis per MRI imaging  in 2017 for pulsatile tinnitus, placed on statin and ASA 81 mg .  Recent leg cramps and she held the simvastatin for  2 weeks to see if it helped her cramps. It did and she has felt much better off the statin. CK was normal. She will need to restart a statin for and consider Crestor which is more hydrophilic and has less muscle SE.

## 2019-07-22 LAB — CYTOLOGY - PAP
Comment: NEGATIVE
Diagnosis: NEGATIVE
High risk HPV: NEGATIVE

## 2019-07-24 ENCOUNTER — Telehealth: Payer: Self-pay | Admitting: Nurse Practitioner

## 2019-07-24 DIAGNOSIS — I672 Cerebral atherosclerosis: Secondary | ICD-10-CM

## 2019-07-24 DIAGNOSIS — I6523 Occlusion and stenosis of bilateral carotid arteries: Secondary | ICD-10-CM

## 2019-07-24 MED ORDER — ROSUVASTATIN CALCIUM 5 MG PO TABS: 5.0000 mg | ORAL_TABLET | Freq: Every day | ORAL | 3 refills | Status: DC

## 2019-07-24 NOTE — Telephone Encounter (Signed)
Please call her with results: 1.   PAP test that is negative for human papilloma virus. The cervix shows (age related) atrophy. Repeat in 1 year until age 64.   2. She has a cystocele and a urethral caruncle. I would like her to see Urology to confirm and render  opinion if estrogen cream would be needed. She is asymptomatic.  Her breast cancer history to be considered.   3. Please begin a new statin- Crestor which has the least SE of the cholesterol medication.  We can start low dose at 5 mg daily and monitor for muscle aches. This is treatment for her intracranial atherosclerosis shown on a previous MRI.  Her lipid panel has been very good.  We will follow this along on Crestor and see if her lipids remain excellent. She is to remain on aspirin 81 mg daily.  4. She will need lab appt  in 3 mos July- please arrange lab appt.

## 2019-07-25 ENCOUNTER — Other Ambulatory Visit: Payer: Self-pay

## 2019-07-25 NOTE — Telephone Encounter (Signed)
Spoke with patient about lab results. Patient will follow up with her urologist Dr. Brigitte Pulse. Patient agreed to new statin Crestor; new rx sent [facility]. Advised patient to call if she starts to have any muscle aches. Scheduled patient for lab appt on 10/24/2019 at 8:30am to recheck labs.

## 2019-07-26 ENCOUNTER — Encounter: Payer: Self-pay | Admitting: Nurse Practitioner

## 2019-07-31 ENCOUNTER — Telehealth: Payer: Self-pay | Admitting: Nurse Practitioner

## 2019-07-31 NOTE — Telephone Encounter (Signed)
"  Rejection Reason - Patient Declined - patient declined appointment " Ariana Lopez said about 3 hours ago

## 2019-08-09 ENCOUNTER — Encounter: Payer: Self-pay | Admitting: Internal Medicine

## 2019-08-09 ENCOUNTER — Ambulatory Visit: Payer: No Typology Code available for payment source | Admitting: Internal Medicine

## 2019-08-09 ENCOUNTER — Other Ambulatory Visit: Payer: Self-pay

## 2019-08-09 VITALS — BP 124/78 | HR 63 | Temp 98.0°F | Ht 68.0 in | Wt 206.8 lb

## 2019-08-09 DIAGNOSIS — E559 Vitamin D deficiency, unspecified: Secondary | ICD-10-CM | POA: Diagnosis not present

## 2019-08-09 DIAGNOSIS — E041 Nontoxic single thyroid nodule: Secondary | ICD-10-CM

## 2019-08-09 LAB — VITAMIN D 25 HYDROXY (VIT D DEFICIENCY, FRACTURES): VITD: 26.73 ng/mL — ABNORMAL LOW (ref 30.00–100.00)

## 2019-08-09 NOTE — Progress Notes (Signed)
Name: Ariana Lopez  MRN/ DOB: [id number], January 12, 1956    Age/ Sex: 64 y.o., female    PCP: Marval Regal, NP   Reason for Endocrinology Evaluation: Hx of thyroid nodules and Vitamin D deficiency     Date of Initial Endocrinology Evaluation: 08/09/2019     HPI: Ms. Ariana Lopez is a 64 y.o. female with a past medical history of Hx of Breast Ca.  The patient presented for initial endocrinology clinic visit on 08/09/2019 for consultative assistance with her Thyroid nodule.   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 Afirma results came back suspicious.  Repeat thyroid ultrasound in  08/2017 showed enlargement of the left inferior nodule.   Pt has been offered surgery    She has fluctuating  No constipation  No depression nor anxiety   No FH of thyroid disease      On vitamin D 50 mcg daily  ( 2000 iu daily ) Eats one serving of calcium daily   HISTORY:  Past Medical History:  Past Medical History:  Diagnosis Date  . Allergy   . Breast cancer, left ([facility]) 2005   Lumpectomy and sentinel lymph nodes resected, chemo + rad tx's.   Marland Kitchen History of thyroid nodule   . History of tinnitus   . Low vitamin D level 07/21/2019  . Obesity (BMI 30.0-34.9) 11/13/2012  . Sleep apnea   . Thyroid nodule 07/21/2019    Past Surgical History:  Past Surgical History:  Procedure Laterality Date  . BREAST SURGERY    . CESAREAN SECTION  2000  . COLONOSCOPY WITH PROPOFOL N/A 06/10/2016   Procedure: COLONOSCOPY WITH PROPOFOL;  Surgeon: Jonathon Bellows, MD;  Location: [facility] ENDOSCOPY;  Service: Endoscopy;  Laterality: N/A;  . left breast lumpectomy Left 2002      Social History:  reports that she has quit smoking. She has never  used smokeless tobacco. She reports current alcohol use. She reports that she does not use drugs.  Family History: family history includes Cancer (age of onset: 78) in her sister; Heart attack in her brother; Heart disease in her father; Hypertension in her father; Stroke in her mother.   HOME MEDICATIONS: Allergies as of 08/09/2019      Reactions   Augmentin [amoxicillin-pot Clavulanate]    Insomnia/headache /nausea. Able to take PCN and Amox.   Gabapentin Nausea Only      Medication List       Accurate as of Aug 09, 2019  7:36 AM. If you have any questions, ask your nurse or doctor.        aspirin EC 81 MG tablet Take 81 mg by mouth daily.   azelastine 0.1 % nasal spray Commonly known as: ASTELIN Place into the nose.   fluticasone 50 MCG/ACT nasal spray Commonly known as: FLONASE PLACE 2 SPRAYS INTO BOTH NOSTRILS DAILY.   rosuvastatin 5 MG tablet Commonly known as: Crestor Take 1 tablet (5 mg total) by mouth daily.   Trokendi XR 50 MG Cp24 Generic drug: Topiramate ER Take 50 mg by mouth daily.         REVIEW OF SYSTEMS: A comprehensive ROS was conducted with the patient and is negative except as per HPI and below:  ROS  OBJECTIVE:  VS: There were no vitals taken for this visit.   Wt Readings from Last 3 Encounters:  07/19/19 210 lb (95.3 kg)  06/07/19 208 lb 3.2 oz (94.4 kg)  03/08/17 222 lb 8 oz (100.9 kg)     EXAM: General: Pt appears well and is in NAD  Eyes: External eye exam normal without stare, lid lag or exophthalmos.  EOM intact.   Neck: General: Supple without adenopathy. Thyroid: Thyroid size normal. Left thyroid nodule appreciated.   Lungs: Clear with good BS bilat with no rales, rhonchi, or wheezes  Heart: Auscultation: RRR.  Abdomen: Normoactive bowel sounds, soft, nontender, without masses or organomegaly palpable  Extremities:  BL LE: No pretibial edema normal ROM and strength.  Skin: Hair: Texture and amount normal with gender  appropriate distribution Skin Inspection: No rashes Skin Palpation: Skin temperature, texture, and thickness normal to palpation  Neuro: Cranial nerves: II - XII grossly intact Motor: Normal strength throughout DTRs: 2+ and symmetric in UE without delay in relaxation phase  Mental Status: Judgment, insight: Intact Orientation: Oriented to time, place, and person Mood and affect: No depression, anxiety, or agitation     DATA REVIEWED: Results for MERYEM, NEELEY (MRN [id number]) as of 08/09/2019 12:49  Ref. Range 06/13/2019 09:57 08/09/2019 10:46  VITD Latest Ref Range: 30.00 - 100.00 ng/mL 18.91 (L) 26.73 (L)     Results for EMYA, PHAIR (MRN [id number]) as of 08/09/2019 07:37  Ref. Range 06/13/2019 09:57  TSH Latest Ref Range: 0.35 - 4.50 uIU/mL 0.96      FNA of left inferior nodule [id number]  - FOLLICULAR NEOPLASM, HURTHLE CELL TYPE (BETHESDA CATEGORY 4). ASSESSMENT/PLAN/RECOMMENDATIONS:   1. Left Thyroid Nodule   - No local neck symptoms - She is clinically and bio chemically euthyroid  - We have reviewed her previous abnormal FNA from 2018 ( Bethesda Category IV), Afirma came back suspicious, per pt she was offered surgical intervention but she declined as she was asymptomatic  - We discussed the high risk of thyroid cancer with above results  - Will proceed with repeat thyroid ultrasound      2. Vitamin D Deficiency:  - She is compliant with Vitamin D intake - She is not on calcium Supplements, she is on low calcium diet - Discussed the importance of optimal calcium/vitamin D intake. She was given the option to eith start OTC Calcium vs increasing calcium intake through her diet - Per [employer] ( [employer]) she will be due for DXA at age 30, this can be ordered through her PCP's office  during her physical exam appointment.  - Vitamin D level is improving    Medication:  Continue Vitamin D3 50 mcg daily ( 2000 iu  daily)  F/U in 6 months     Signed electronically by: Mack Guise, MD  [facility] Endocrinology  [facility] [address] [place], [place] [address] Phone: [phone] FAX: [phone]   CC: Marval Regal, NP [id number] [place] [address] Phone: [phone] Fax: [phone]   Return to Endocrinology clinic as below: Future Appointments  Date Time Provider [place]  08/09/2019  9:50 AM Makyia Erxleben, Melanie Crazier, MD [facility] None  10/24/2019  8:30 AM [facility]  01/23/2020 11:00 AM Marval Regal, NP [facility]

## 2019-08-09 NOTE — Patient Instructions (Addendum)
-   Calcium 500 mg Twice daily  - Vitamin D3 50 mcg daily  - I have ordered a thyroid ultrasound

## 2019-08-19 ENCOUNTER — Ambulatory Visit: Payer: No Typology Code available for payment source | Admitting: Dermatology

## 2019-09-06 ENCOUNTER — Other Ambulatory Visit: Payer: No Typology Code available for payment source

## 2019-09-06 ENCOUNTER — Ambulatory Visit
Admission: RE | Admit: 2019-09-06 | Discharge: 2019-09-06 | Disposition: A | Discharge status: Home / Self Care | Payer: No Typology Code available for payment source | Source: Ambulatory Visit | Attending: Internal Medicine | Admitting: Internal Medicine

## 2019-09-06 DIAGNOSIS — E041 Nontoxic single thyroid nodule: Secondary | ICD-10-CM

## 2019-09-09 ENCOUNTER — Telehealth: Payer: Self-pay | Admitting: Internal Medicine

## 2019-09-09 NOTE — Telephone Encounter (Signed)
Patient returned Dr. Quin Hoop call. Patient is available after 6 pm today at ph# [phone] (patient is at work). If Dr. Kelton Pillar is unable to call patient after 6 p.m. today, patient states she will call Dr. Kelton Pillar tomorrow during her break (typically between 9:30 a.m. and 9:45 a.m.). Patient states her days off are on Thursdays and Fridays.

## 2019-09-09 NOTE — Telephone Encounter (Signed)
I have attempted to call the pt to discuss her thyroid ultrasound results on 09/09/2019 at 9 :15 AM. To discuss the need to proceed with left thyroid nodule FNA.     The call went directly to voice mail , left a message for a call back      [place], MD  [facility] Endocrinology  [facility] [address] [place], [place] [address] Phone: [phone] FAX: [phone]

## 2019-09-10 NOTE — Telephone Encounter (Signed)
I have attempted to call the pt again on 09/10/2019 at 9:33 AM per her request. Call went directly to her voice mail.   A letter will be mailed in 2 days if she does not call back.   I have left a message yesterday at 6/14 ~ 1630 discussing the need to proceed with FNA of the thyroid nodule  As it was abnormal in the past .    Abby Nena Jordan, MD  [facility] Endocrinology  [facility] [address] [place], [place] [address] Phone: [phone] FAX: [phone]

## 2019-09-12 ENCOUNTER — Encounter: Payer: Self-pay | Admitting: Internal Medicine

## 2019-09-12 ENCOUNTER — Telehealth: Payer: Self-pay | Admitting: Internal Medicine

## 2019-09-12 DIAGNOSIS — E041 Nontoxic single thyroid nodule: Secondary | ICD-10-CM

## 2019-09-12 NOTE — Telephone Encounter (Signed)
Multiple attempts to call the pt to discuss her thyroid ultrasound results and the need to proceed with FNA of the left inferior nodule due to previously suspicion FNA and Afirma.     No call back by 06/21/6145 A certified letter will be mailed to the pt     [place], MD  [facility] Endocrinology  [facility] [address] [place], [place] [address] Phone: [phone] FAX: [phone]

## 2019-09-13 NOTE — Telephone Encounter (Signed)
Patient called this morning and stated that she received the voicemail from Dr. Kelton Pillar.  The patient was given the message that she should be receiving a letter via certified mail with this information as well. Pt verbalized understanding. Pt was informed of the process this office follows for referrals for matters such as biospies. She was informed that Dr. Kelton Pillar will be notified that the pt has returned the phone call and when Dr. Kelton Pillar returns, she will place an order for the biopsy, insurance will be verified by this office and a time and date will be set for the pt to have the biopsy. Pt verbalized understanding of this.   Pt did request that if at all possible, the biopsy be scheduled on a Friday morning, as early as possible. If this time is not available, Thursday will work as well, and again, as early as possible.

## 2019-09-16 NOTE — Addendum Note (Signed)
Addended by: Dorita Sciara on: 09/16/2019 08:30 AM   Modules accepted: Orders

## 2019-09-16 NOTE — Telephone Encounter (Signed)
FNA order placed

## 2019-09-20 ENCOUNTER — Ambulatory Visit
Admission: RE | Admit: 2019-09-20 | Discharge: 2019-09-20 | Disposition: A | Discharge status: Home / Self Care | Payer: BLUE CROSS/BLUE SHIELD | Source: Ambulatory Visit | Attending: Nurse Practitioner | Admitting: Nurse Practitioner

## 2019-09-20 DIAGNOSIS — Z1231 Encounter for screening mammogram for malignant neoplasm of breast: Secondary | ICD-10-CM | POA: Diagnosis not present

## 2019-09-20 DIAGNOSIS — Z853 Personal history of malignant neoplasm of breast: Secondary | ICD-10-CM | POA: Diagnosis not present

## 2019-09-20 HISTORY — DX: Personal history of antineoplastic chemotherapy: Z92.21

## 2019-09-20 HISTORY — DX: Personal history of irradiation: Z92.3

## 2019-09-24 NOTE — Telephone Encounter (Signed)
Pt received the certified letter on 09/22/61 at 230

## 2019-09-25 ENCOUNTER — Telehealth: Payer: Self-pay | Admitting: Nurse Practitioner

## 2019-09-25 NOTE — Telephone Encounter (Signed)
Pt called back again said she is at work during the day ,don't know why she not getting messages vm is not full

## 2019-09-25 NOTE — Telephone Encounter (Signed)
Tried to call patient back again to give her lab results; patient did not answer and voicemail is full or I would leave her a message

## 2019-09-25 NOTE — Telephone Encounter (Signed)
Tried to call patient but no answer and voicemail is full

## 2019-09-25 NOTE — Telephone Encounter (Signed)
Pt called she said she was returning a call

## 2019-09-26 NOTE — Telephone Encounter (Signed)
Patient aware of mammogram results.  

## 2019-10-03 ENCOUNTER — Other Ambulatory Visit (HOSPITAL_COMMUNITY)
Admission: RE | Admit: 2019-10-03 | Discharge: 2019-10-03 | Disposition: A | Discharge status: Home / Self Care | Payer: BLUE CROSS/BLUE SHIELD | Source: Ambulatory Visit | Attending: Physician Assistant | Admitting: Physician Assistant

## 2019-10-03 ENCOUNTER — Ambulatory Visit
Admission: RE | Admit: 2019-10-03 | Discharge: 2019-10-03 | Disposition: A | Discharge status: Home / Self Care | Payer: No Typology Code available for payment source | Source: Ambulatory Visit | Attending: Internal Medicine | Admitting: Internal Medicine

## 2019-10-03 DIAGNOSIS — E041 Nontoxic single thyroid nodule: Secondary | ICD-10-CM | POA: Diagnosis not present

## 2019-10-07 LAB — CYTOLOGY - NON PAP

## 2019-10-10 ENCOUNTER — Telehealth: Payer: Self-pay | Admitting: Internal Medicine

## 2019-10-10 NOTE — Telephone Encounter (Signed)
Attempted to call the pt on 10/10/2019 at 1020 AM . Call went straight to voicemail.   Left a message for the pt that her FNA  Result came back similar to the past with abnormal results ( Bethesda Category IV)   The risk of cancer is  ~ 50% . I again stongly recommended proceeding with surgery.   Pt advised to contact the office if she is ok with referral. Otherwise I informed her I am out of the office this week and we can discuss further next week when I am back.     FNA 10/03/2019  Clinical History: Nodule #3: 3.1 x 2.6 x 2.6 cm Inferior Left,  previously 2.6 x 2.5 x 2.4; this was previously biopsied  Specimen Submitted: A. THYROID, LLP, FINE NEEDLE ASPIRATION:    FINAL MICROSCOPIC DIAGNOSIS:  - Findings consistent with a hurthle cell lesion and/or neoplasm  (Bethesda category IV)   Abby Nena Jordan, MD  [facility] Endocrinology  [facility] [address] [place], [place] [address] Phone: [phone] FAX: [phone]

## 2019-10-14 ENCOUNTER — Telehealth: Payer: Self-pay | Admitting: Internal Medicine

## 2019-10-14 ENCOUNTER — Encounter: Payer: Self-pay | Admitting: Internal Medicine

## 2019-10-14 NOTE — Telephone Encounter (Signed)
Attempted to call the pt again on 10/14/2019 at 10 Am , no answer . Pt did not call from my call last week . A letter will be sent to the pt explaining the need for thyroid surgery due to increased risk of thyroid cancer with abnormal FNA.      Abby Nena Jordan, MD  [facility] Endocrinology  [facility] [address] [place], [place] [address] Phone: [phone] FAX: [phone]

## 2019-10-21 ENCOUNTER — Telehealth: Payer: Self-pay | Admitting: Nurse Practitioner

## 2019-10-21 NOTE — Telephone Encounter (Signed)
Spoke with patient and discussed which vaccine she would need a booster for. She needs zoster done at next appt.

## 2019-10-21 NOTE — Telephone Encounter (Signed)
LMTCB about which shots she is referring to

## 2019-10-21 NOTE — Telephone Encounter (Signed)
Pt wanted to know if she is due for an booster shots

## 2019-10-24 ENCOUNTER — Telehealth: Payer: Self-pay

## 2019-10-24 ENCOUNTER — Other Ambulatory Visit: Payer: Self-pay

## 2019-10-24 ENCOUNTER — Ambulatory Visit (INDEPENDENT_AMBULATORY_CARE_PROVIDER_SITE_OTHER): Payer: No Typology Code available for payment source

## 2019-10-24 ENCOUNTER — Other Ambulatory Visit (INDEPENDENT_AMBULATORY_CARE_PROVIDER_SITE_OTHER): Payer: No Typology Code available for payment source

## 2019-10-24 DIAGNOSIS — Z23 Encounter for immunization: Secondary | ICD-10-CM | POA: Diagnosis not present

## 2019-10-24 DIAGNOSIS — I672 Cerebral atherosclerosis: Secondary | ICD-10-CM

## 2019-10-24 DIAGNOSIS — I6523 Occlusion and stenosis of bilateral carotid arteries: Secondary | ICD-10-CM

## 2019-10-24 LAB — HEPATIC FUNCTION PANEL
ALT: 11 U/L (ref 0–35)
AST: 11 U/L (ref 0–37)
Albumin: 3.8 g/dL (ref 3.5–5.2)
Alkaline Phosphatase: 100 U/L (ref 39–117)
Bilirubin, Direct: 0.2 mg/dL (ref 0.0–0.3)
Total Bilirubin: 0.8 mg/dL (ref 0.2–1.2)
Total Protein: 6.3 g/dL (ref 6.0–8.3)

## 2019-10-24 LAB — LIPID PANEL
Cholesterol: 139 mg/dL (ref 0–200)
HDL: 56.8 mg/dL (ref >39.00)
LDL Cholesterol: 70 mg/dL (ref 0–99)
NonHDL: 81.73
Total CHOL/HDL Ratio: 2
Triglycerides: 60 mg/dL (ref 0.0–149.0)
VLDL: 12 mg/dL (ref 0.0–40.0)

## 2019-10-24 NOTE — Telephone Encounter (Signed)
Patient is in office wanting to get her shingles vaccine. She was here for labs and wanted to have this done as well while she's here. Is it okay to give her today?

## 2019-10-24 NOTE — Progress Notes (Signed)
Patient presented for shingrix injection to right deltoid, patient voiced no concerns nor showed any signs of distress during injection. 

## 2019-10-24 NOTE — Telephone Encounter (Signed)
She had  a shingles vaccine in 2015.   If she is feeling well and desires the Shingrix vaccine series to be started- she can have the first Shingrix vaccine today.

## 2019-10-29 ENCOUNTER — Encounter (HOSPITAL_COMMUNITY): Payer: Self-pay

## 2020-01-02 ENCOUNTER — Ambulatory Visit (INDEPENDENT_AMBULATORY_CARE_PROVIDER_SITE_OTHER): Payer: No Typology Code available for payment source

## 2020-01-02 ENCOUNTER — Other Ambulatory Visit: Payer: Self-pay

## 2020-01-02 DIAGNOSIS — Z23 Encounter for immunization: Secondary | ICD-10-CM | POA: Diagnosis not present

## 2020-01-23 ENCOUNTER — Ambulatory Visit: Payer: No Typology Code available for payment source | Admitting: Nurse Practitioner

## 2020-01-23 ENCOUNTER — Other Ambulatory Visit: Payer: Self-pay

## 2020-01-23 ENCOUNTER — Encounter: Payer: Self-pay | Admitting: Nurse Practitioner

## 2020-01-23 VITALS — BP 128/72 | HR 68 | Temp 98.1°F | Ht 68.0 in | Wt 211.0 lb

## 2020-01-23 DIAGNOSIS — E785 Hyperlipidemia, unspecified: Secondary | ICD-10-CM | POA: Insufficient documentation

## 2020-01-23 DIAGNOSIS — J309 Allergic rhinitis, unspecified: Secondary | ICD-10-CM

## 2020-01-23 DIAGNOSIS — H9201 Otalgia, right ear: Secondary | ICD-10-CM | POA: Diagnosis not present

## 2020-01-23 DIAGNOSIS — H6690 Otitis media, unspecified, unspecified ear: Secondary | ICD-10-CM

## 2020-01-23 DIAGNOSIS — Z0000 Encounter for general adult medical examination without abnormal findings: Secondary | ICD-10-CM | POA: Diagnosis not present

## 2020-01-23 DIAGNOSIS — E559 Vitamin D deficiency, unspecified: Secondary | ICD-10-CM

## 2020-01-23 DIAGNOSIS — I6523 Occlusion and stenosis of bilateral carotid arteries: Secondary | ICD-10-CM | POA: Diagnosis not present

## 2020-01-23 DIAGNOSIS — E041 Nontoxic single thyroid nodule: Secondary | ICD-10-CM

## 2020-01-23 DIAGNOSIS — N362 Urethral caruncle: Secondary | ICD-10-CM

## 2020-01-23 LAB — COMPREHENSIVE METABOLIC PANEL
ALT: 11 U/L (ref 0–35)
AST: 12 U/L (ref 0–37)
Albumin: 3.8 g/dL (ref 3.5–5.2)
Alkaline Phosphatase: 105 U/L (ref 39–117)
BUN: 15 mg/dL (ref 6–23)
CO2: 24 mEq/L (ref 19–32)
Calcium: 9 mg/dL (ref 8.4–10.5)
Chloride: 109 mEq/L (ref 96–112)
Creatinine, Ser: 0.7 mg/dL (ref 0.40–1.20)
GFR: 91.26 mL/min (ref >60.00)
Glucose, Bld: 111 mg/dL — ABNORMAL HIGH (ref 70–99)
Potassium: 3.6 mEq/L (ref 3.5–5.1)
Sodium: 140 mEq/L (ref 135–145)
Total Bilirubin: 0.7 mg/dL (ref 0.2–1.2)
Total Protein: 6.3 g/dL (ref 6.0–8.3)

## 2020-01-23 LAB — LIPID PANEL
Cholesterol: 139 mg/dL (ref 0–200)
HDL: 60.6 mg/dL (ref >39.00)
LDL Cholesterol: 67 mg/dL (ref 0–99)
NonHDL: 78.27
Total CHOL/HDL Ratio: 2
Triglycerides: 55 mg/dL (ref 0.0–149.0)
VLDL: 11 mg/dL (ref 0.0–40.0)

## 2020-01-23 LAB — B12 AND FOLATE PANEL
Folate: 10.9 ng/mL (ref >5.9)
Vitamin B-12: 1476 pg/mL — ABNORMAL HIGH (ref 211–911)

## 2020-01-23 LAB — VITAMIN D 25 HYDROXY (VIT D DEFICIENCY, FRACTURES): VITD: 25.3 ng/mL — ABNORMAL LOW (ref 30.00–100.00)

## 2020-01-23 MED ORDER — FLUTICASONE PROPIONATE 50 MCG/ACT NA SUSP: NASAL | 11 refills | Status: DC

## 2020-01-23 MED ORDER — ROSUVASTATIN CALCIUM 5 MG PO TABS: 5.0000 mg | ORAL_TABLET | Freq: Every day | ORAL | 3 refills | Status: AC

## 2020-01-23 MED ORDER — AZITHROMYCIN 250 MG PO TABS: ORAL_TABLET | ORAL | 0 refills | Status: DC

## 2020-01-23 MED ORDER — AZELASTINE HCL 0.1 % NA SOLN: 2.0000 | Freq: Every evening | NASAL | 6 refills | Status: AC | PRN

## 2020-01-23 NOTE — Progress Notes (Signed)
Established Patient Office Visit  Subjective:  Patient ID: Ariana Lopez, female    DOB: 1955/09/14  Age: 64 y.o. MRN: [id number]  CC:  Chief Complaint  Patient presents with  . Follow-up    HPI Ariana Lopez is a 64 yo who presents for a 6 month follow up visit. She has an itchy right ear that has been sore for 1 week. No hearing loss. She also has post nasal drainage. She has been out of her nasal sprays. She does not feel ill.  No fever/chills, HA, body aches, CP or SOB or wheezing. Occasional cough- to clear her throat. She has no other concerns.   HLD/intracranial atherosclerosis per MRI 2017 obtained for pulsatile tinnitus. Maintained on ASA 81 mg and rosuvastatin 5 mg daily. She had muscle aches on simvastatin but seems to be tolerating well. Ned LDL <70.  Lab Results  Component Value Date   CHOL 139 01/23/2020   HDL 60.60 01/23/2020   LDLCALC 67 01/23/2020   LDLDIRECT 147.8 10/02/2012   TRIG 55.0 01/23/2020   CHOLHDL 2 01/23/2020   Pulsatile tinnitus right ear/tension type migraines: Followed by Dr. Manuella Ghazi in Neurology and is on Trokendi XR 50 mg daily without breakthrough headache.  Doing well.  Vit D deficiency: Vit D 1000 IU .  She has been advised calcium supplements versus increasing calcium in her diet.  She will be due for DEXA at age 57.  History of thyroid nodule: Clinically euthyroid.  No neck symptoms.  Referred to Endocrinology she was  seen by Dr. Kelton Pillar. 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 Surgical resection was recommended as she has a 50 % chance this is malignancy. Patient was notified and   BMI 32/obesity: eating a regular diet.  Wt Readings from Last 3 Encounters:  01/23/20 211 lb (95.7 kg)  08/09/19 206 lb 12.8 oz (93.8 kg)  07/19/19 210 lb (95.3 kg)    Immunizations: Diet:Healthy  Exercise: no regular Colonoscopy: Done 2018 and was normal  Dexa: Needs done  age 37  Pap Smear: UTD Mammogram: UTD    Past Medical History:  Diagnosis Date  . Allergy   . Breast cancer ([place])   . Breast cancer, left ([place]) 2005   Lumpectomy and sentinel lymph nodes resected, chemo + rad tx's.   Marland Kitchen History of thyroid nodule   . History of tinnitus   . Low vitamin D level 07/21/2019  . Obesity (BMI 30.0-34.9) 11/13/2012  . Personal history of chemotherapy   . Personal history of radiation therapy   . Sleep apnea   . Thyroid nodule 07/21/2019    Past Surgical History:  Procedure Laterality Date  . BREAST BIOPSY    . BREAST LUMPECTOMY Left   . BREAST SURGERY    . CESAREAN SECTION  2000  . COLONOSCOPY WITH PROPOFOL N/A 06/10/2016   Procedure: COLONOSCOPY WITH PROPOFOL;  Surgeon: Jonathon Bellows, MD;  Location: [facility] ENDOSCOPY;  Service: Endoscopy;  Laterality: N/A;  . left breast lumpectomy Left 2002    Family History  Problem Relation Age of Onset  . Cancer Sister 81       breast  . Breast cancer Sister   . Stroke Mother   . Heart disease Father   . Hypertension Father   . Heart attack Brother     Social History   Socioeconomic History  . Marital status: Married    Spouse name:  Not on file  . Number of children: Not on file  . Years of education: Not on file  . Highest education level: Not on file  Occupational History  . Not on file  Tobacco Use  . Smoking status: Former Research scientist (life sciences)  . Smokeless tobacco: Never Used  Substance and Sexual Activity  . Alcohol use: Yes    Comment: occ  . Drug use: No  . Sexual activity: Not Currently  Other Topics Concern  . Not on file  Social History Narrative   G1P1   Married.   Recently moved here from [place].            Social Determinants of Health   Financial Resource Strain:   . Difficulty of Paying Living Expenses: Not on file  Food Insecurity:   . Worried About Charity fundraiser in the Last Year: Not on file  . Ran Out of Food in the Last Year: Not on file  Transportation Needs:   . Lack of  Transportation (Medical): Not on file  . Lack of Transportation (Non-Medical): Not on file  Physical Activity:   . Days of Exercise per Week: Not on file  . Minutes of Exercise per Session: Not on file  Stress:   . Feeling of Stress : Not on file  Social Connections:   . Frequency of Communication with Friends and Family: Not on file  . Frequency of Social Gatherings with Friends and Family: Not on file  . Attends Religious Services: Not on file  . Active Member of Clubs or Organizations: Not on file  . Attends Archivist Meetings: Not on file  . Marital Status: Not on file  Intimate Partner Violence:   . Fear of Current or Ex-Partner: Not on file  . Emotionally Abused: Not on file  . Physically Abused: Not on file  . Sexually Abused: Not on file    Outpatient Medications Prior to Visit  Medication Sig Dispense Refill  . aspirin EC 81 MG tablet Take 81 mg by mouth daily.    . Cholecalciferol (VITAMIN D3 PO) Take by mouth.    . Cyanocobalamin (B-12) 1000 MCG CAPS Take by mouth.    . Multiple Vitamins-Minerals (VITAMIN D3 COMPLETE PO) Take by mouth.    . TROKENDI XR 50 MG CP24 Take 50 mg by mouth daily.    Marland Kitchen azelastine (ASTELIN) 0.1 % nasal spray Place into the nose.    . fluticasone (FLONASE) 50 MCG/ACT nasal spray PLACE 2 SPRAYS INTO BOTH NOSTRILS DAILY. 16 g 11  . rosuvastatin (CRESTOR) 5 MG tablet Take 1 tablet (5 mg total) by mouth daily. 90 tablet 3   No facility-administered medications prior to visit.    Allergies  Allergen Reactions  . Augmentin [Amoxicillin-Pot Clavulanate]     Insomnia/headache /nausea. Able to take PCN and Amox.  . Gabapentin Nausea Only    Review of Systems  Constitutional: Negative for chills and fever.  HENT: Positive for ear pain and postnasal drip. Negative for congestion, ear discharge, facial swelling and hearing loss.   Eyes: Negative.   Respiratory: Positive for cough.   Cardiovascular: Negative.   Gastrointestinal:  Negative.   Endocrine: Negative.   Genitourinary: Negative.   Musculoskeletal: Negative.   Allergic/Immunologic: Negative.   Neurological: Negative.   Hematological: Negative.   Psychiatric/Behavioral:       No concerns about depression or anxiety. No SI/HI.       Objective:    Physical Exam Vitals reviewed.  Constitutional:      Appearance: She is obese.  HENT:     Head: Normocephalic and atraumatic.     Right Ear: Ear canal and external ear normal. There is no impacted cerumen.     Left Ear: Tympanic membrane, ear canal and external ear normal. There is no impacted cerumen.     Ears:     Comments: Rt TM is slightly red.     Nose: Nose normal.     Mouth/Throat:     Mouth: Mucous membranes are moist.     Pharynx: Oropharynx is clear.     Comments: Post pharynx is normal Eyes:     Conjunctiva/sclera: Conjunctivae normal.     Pupils: Pupils are equal, round, and reactive to light.  Cardiovascular:     Rate and Rhythm: Normal rate and regular rhythm.     Pulses: Normal pulses.     Heart sounds: Normal heart sounds.  Pulmonary:     Effort: Pulmonary effort is normal.     Breath sounds: Normal breath sounds.  Abdominal:     Palpations: Abdomen is soft.     Tenderness: There is no abdominal tenderness.  Musculoskeletal:        General: Normal range of motion.     Cervical back: Normal range of motion.  Skin:    General: Skin is warm and dry.  Neurological:     General: No focal deficit present.     Mental Status: She is alert and oriented to person, place, and time.  Psychiatric:        Mood and Affect: Mood normal.        Behavior: Behavior normal.     BP 128/72 (BP Location: Left Arm, Patient Position: Sitting, Cuff Size: Normal)   Pulse 68   Temp 98.1 F (36.7 C) (Oral)   Ht '5\' 8"'  (1.727 m)   Wt 211 lb (95.7 kg)   SpO2 98%   BMI 32.08 kg/m  Wt Readings from Last 3 Encounters:  01/23/20 211 lb (95.7 kg)  08/09/19 206 lb 12.8 oz (93.8 kg)  07/19/19 210  lb (95.3 kg)   Pulse Readings from Last 3 Encounters:  01/23/20 68  08/09/19 63  07/19/19 (!) 54    BP Readings from Last 3 Encounters:  01/23/20 128/72  08/09/19 124/78  07/19/19 120/70    Lab Results  Component Value Date   CHOL 139 01/23/2020   HDL 60.60 01/23/2020   LDLCALC 67 01/23/2020   LDLDIRECT 147.8 10/02/2012   TRIG 55.0 01/23/2020   CHOLHDL 2 01/23/2020      Health Maintenance Due  Topic Date Due  . HIV Screening  Never done    There are no preventive care reminders to display for this patient.  Lab Results  Component Value Date   TSH 0.96 06/13/2019   Lab Results  Component Value Date   WBC 5.2 06/13/2019   HGB 12.9 06/13/2019   HCT 39.3 06/13/2019   MCV 96.3 06/13/2019   PLT 215.0 06/13/2019   Lab Results  Component Value Date   NA 140 01/23/2020   K 3.6 01/23/2020   CO2 24 01/23/2020   GLUCOSE 111 (H) 01/23/2020   BUN 15 01/23/2020   CREATININE 0.70 01/23/2020   BILITOT 0.7 01/23/2020   ALKPHOS 105 01/23/2020   AST 12 01/23/2020   ALT 11 01/23/2020   PROT 6.3 01/23/2020   ALBUMIN 3.8 01/23/2020   CALCIUM 9.0 01/23/2020   GFR 91.26 01/23/2020   Lab  Results  Component Value Date   CHOL 139 01/23/2020   Lab Results  Component Value Date   HDL 60.60 01/23/2020   Lab Results  Component Value Date   LDLCALC 67 01/23/2020   Lab Results  Component Value Date   TRIG 55.0 01/23/2020   Lab Results  Component Value Date   CHOLHDL 2 01/23/2020   Lab Results  Component Value Date   HGBA1C 4.9 06/13/2019      Assessment & Plan:   Problem List Items Addressed This Visit      Cardiovascular and Mediastinum   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   Relevant Medications   rosuvastatin (CRESTOR) 5 MG tablet     Respiratory   Allergic rhinitis     Endocrine   Thyroid nodule     Nervous and Auditory   Acute otitis media   Relevant Medications   azithromycin (ZITHROMAX) 250 MG tablet      Genitourinary   Urethral caruncle   Relevant Orders   Ambulatory referral to Urology     Other   Vitamin D deficiency   Relevant Orders   Vitamin D (25 hydroxy) (Completed)   Hyperlipidemia   Relevant Medications   rosuvastatin (CRESTOR) 5 MG tablet   Other Relevant Orders   Lipid Profile (Completed)   Comp Met (CMET) (Completed)   Right ear pain - Primary    Other Visit Diagnoses    Preventative health care       Relevant Orders   B12 and Folate Panel (Completed)     Please go to the lab today.  For the right ear pain and tympanic membrane redness, begin Z-Pak for mild ear infection. You are allergic to Amoxicillin.  Please start back on your nasal sprays as they  may be helpful in decreasing post nasal drainage. Tylenol as needed for pain.  Call back next week if no resolution will refer to ENT as needed.  For the urethral mucosa changes  - will ask Urology- Dr. Erlene Quan to evaluate.   You can get the Covid Booster 6 mos after your last vaccine.   Follow up in 6 months.   Addendum: labs: Vit D is low - Stay on the Vit D3 1000 IU daily.  Lipids: at goal on Crestor 5 mg- stay on this .  Your labs show normal chemistry, kidney, liver, blood sugar.  B12 is elevated may cut back on the B vitamin supplements.   She needs to follow up with ENDO re abnormal thyroid Bx recommendations for surgical resection.  Follow-up: No follow-ups on file.   This visit occurred during the SARS-CoV-2 public health emergency.  Safety protocols were in place, including screening questions prior to the visit, additional usage of staff PPE, and extensive cleaning of exam room while observing appropriate contact time as indicated for disinfecting solutions.   Denice Paradise, NP

## 2020-01-23 NOTE — Patient Instructions (Addendum)
Please go to the lab today.  For the right ear pain and tympanic membrane redness, begin Z-Pak for mild ear infection. You are allergic to Amoxicillin.    Please start back on your nasal sprays as they  may be helpful in decreasing post nasal drainage. Tylenol as needed for pain.  Call back next week if no resolution will refer to ENT as needed.  For the urethral mucosa changes  - will ask Urology- Dr. Erlene Quan to evaluate.   You can get the Covid Booster 6 mos after your last vaccine.   Follow up in 6 months.

## 2020-01-26 ENCOUNTER — Telehealth: Payer: Self-pay | Admitting: Nurse Practitioner

## 2020-01-26 ENCOUNTER — Encounter: Payer: Self-pay | Admitting: Nurse Practitioner

## 2020-01-26 NOTE — Telephone Encounter (Signed)
I called pt to discuss her lab work and to inquire about her decision to seek surgical consult for her thyroid nodule biopsy abnormal results found in July.

## 2020-01-27 NOTE — Telephone Encounter (Signed)
Patient was returning call to Denice Paradise about surgical consult

## 2020-01-27 NOTE — Telephone Encounter (Signed)
I called her back and she plans  to call Dr. Pryor Ochoa about getting her thyroid surgery. She is already his patient and says she does not need a referral.   We discussed the need to have this nodule with 50% risk of malignancy removed. She voices understanding and will set up her surgical appt. She wants to have a Garment/textile technologist do it as she cannot find a driver for [facility] location.

## 2020-02-06 ENCOUNTER — Ambulatory Visit: Payer: Self-pay | Admitting: Urology

## 2020-02-07 ENCOUNTER — Telehealth: Payer: Self-pay | Admitting: Internal Medicine

## 2020-02-07 NOTE — Telephone Encounter (Signed)
Discussed AFirma result with the pt which is benign   Abby Nena Jordan, MD  [facility]  [facility] [address] [place], [place] [address] Phone: [phone] FAX: [phone]

## 2020-02-13 ENCOUNTER — Ambulatory Visit: Payer: No Typology Code available for payment source | Admitting: Internal Medicine

## 2020-02-13 ENCOUNTER — Telehealth: Payer: Self-pay

## 2020-02-13 NOTE — Telephone Encounter (Signed)
Pt called and made an appt for tomorrow but wants to know what to do in the meantime for the pain in her left knee and hands. Please advise

## 2020-02-13 NOTE — Telephone Encounter (Signed)
Patient has been taking ibuprofen but was not helping. Podiatrist gave patient diclofenac 75 mg tabs for her foot but those are not helping either. Patient wants to know if there is anything else she can take or do until she sees you tomorrow. She is in a lot of pain and nothing seems to be helping and hand is very swollen; she can not even make a fist.

## 2020-02-13 NOTE — Telephone Encounter (Signed)
Spoke with patient and advised patient to go to [facility] or [facility] as advised by Dawson Bills, NP. Patient refused and will still come to appt tomorrow at 11:30 am.

## 2020-02-13 NOTE — Telephone Encounter (Signed)
She should be seen at [facility] or [facility].

## 2020-02-14 ENCOUNTER — Ambulatory Visit: Payer: No Typology Code available for payment source | Admitting: Nurse Practitioner

## 2020-02-14 ENCOUNTER — Encounter: Payer: Self-pay | Admitting: Nurse Practitioner

## 2020-02-14 ENCOUNTER — Other Ambulatory Visit: Payer: Self-pay

## 2020-02-14 VITALS — BP 124/80 | HR 63 | Temp 97.6°F | Ht 68.0 in | Wt 211.0 lb

## 2020-02-14 DIAGNOSIS — R2231 Localized swelling, mass and lump, right upper limb: Secondary | ICD-10-CM | POA: Diagnosis not present

## 2020-02-14 DIAGNOSIS — M79641 Pain in right hand: Secondary | ICD-10-CM | POA: Diagnosis not present

## 2020-02-14 DIAGNOSIS — R2232 Localized swelling, mass and lump, left upper limb: Secondary | ICD-10-CM

## 2020-02-14 DIAGNOSIS — M79642 Pain in left hand: Secondary | ICD-10-CM | POA: Diagnosis not present

## 2020-02-14 DIAGNOSIS — M79643 Pain in unspecified hand: Secondary | ICD-10-CM

## 2020-02-14 LAB — SEDIMENTATION RATE: Sed Rate: 14 mm/hr (ref 0–30)

## 2020-02-14 LAB — C-REACTIVE PROTEIN: CRP: 1.1 mg/dL (ref 0.5–20.0)

## 2020-02-14 NOTE — Patient Instructions (Addendum)
Please go to lab today  Referral placed to Rheumatology  Please take the Diclofenac as directed.  Hand Pain Many things can cause hand pain. Some common causes are:  An injury.  Repeating the same movement with your hand over and over (overuse).  Osteoporosis.  Arthritis.  Lumps in the tendons or joints of the hand and wrist (ganglion cysts).  Nerve compression syndromes (carpal tunnel syndrome).  Inflammation of the tendons (tendinitis).  Infection. Follow these instructions at home: Pay attention to any changes in your symptoms. Take these actions to help with your discomfort: Managing pain, stiffness, and swelling   Take over-the-counter and prescription medicines only as told by your health care provider.  Wear a hand splint or support as told by your health care provider.  If directed, put ice on the affected area: ? Put ice in a plastic bag. ? Place a towel between your skin and the bag. ? Leave the ice on for 20 minutes, 2-3 times a day. Activity  Take breaks from repetitive activity often.  Avoid activities that make your pain worse.  Minimize stress on your hands and wrists as much as possible.  Do stretches or exercises as told by your health care provider.  Do not do activities that make your pain worse. Contact a health care provider if:  Your pain does not get better after a few days of self-care.  Your pain gets worse.  Your pain affects your ability to do your daily activities. Get help right away if:  Your hand becomes warm, red, or swollen.  Your hand is numb or tingling.  Your hand is extremely swollen or deformed.  Your hand or fingers turn white or blue.  You cannot move your hand, wrist, or fingers. Summary  Many things can cause hand pain.  Contact your health care provider if your pain does not get better after a few days of self care.  Minimize stress on your hands and wrists as much as possible.  Do not do activities  that make your pain worse. This information is not intended to replace advice given to you by your health care provider. Make sure you discuss any questions you have with your health care provider. Document Revised: 12/08/2017 Document Reviewed: 12/08/2017 Elsevier Patient Education  [place].

## 2020-02-14 NOTE — Progress Notes (Signed)
Established Patient Office Visit  Subjective:  Patient ID: Ariana Lopez, female    DOB: March 09, 1956  Age: 64 y.o. MRN: [id number]  CC:  Chief Complaint  Patient presents with  . Acute Visit    bilateral hand swelling and knee pain    HPI Ariana Lopez is a 64 yo who presents for swollen hands.  Patient recalls that the right hand started to swell 3 or 4 weeks ago, and the left hand started to swell 2 weeks ago.  Left hand was more swollen yesterday than it is today.  She has been unable to make a fist in the left  Hand d/t swelling. Her hand MCP joints are mildly tender. No swelling or pain DIP or PIP joints. Wrist and elbow and shoulder are WNL. She also had some left knee discomfort type pain that resolved.  She has had problems with right ankle and arch pain, tibialis tendinitis followed by Podiatry.  She has been taking  Diclofenac 75 mg twice daily  for her RT foot pain but it has not been helping her hand pain.  She works for [employer] and [employer].  Reports she has done no unusual work.  No injury or trauma to the hands.  No history of arthritis or  rheumatoid arthritis.   Past Medical History:  Diagnosis Date  . Allergy   . Breast cancer ([place])   . Breast cancer, left ([place]) 2005   Lumpectomy and sentinel lymph nodes resected, chemo + rad tx's.   Marland Kitchen History of thyroid nodule   . History of tinnitus   . Low vitamin D level 07/21/2019  . Obesity (BMI 30.0-34.9) 11/13/2012  . Personal history of chemotherapy   . Personal history of radiation therapy   . Sleep apnea   . Thyroid nodule 07/21/2019    Past Surgical History:  Procedure Laterality Date  . BREAST BIOPSY    . BREAST LUMPECTOMY Left   . BREAST SURGERY    . CESAREAN SECTION  2000  . COLONOSCOPY WITH PROPOFOL N/A 06/10/2016   Procedure: COLONOSCOPY WITH PROPOFOL;  Surgeon: Jonathon Bellows, MD;  Location: [facility] ENDOSCOPY;  Service: Endoscopy;  Laterality: N/A;  . left breast lumpectomy Left 2002    Family History    Problem Relation Age of Onset  . Cancer Sister 82       breast  . Breast cancer Sister   . Stroke Mother   . Heart disease Father   . Hypertension Father   . Heart attack Brother     Social History   Socioeconomic History  . Marital status: Married    Spouse name: Not on file  . Number of children: Not on file  . Years of education: Not on file  . Highest education level: Not on file  Occupational History  . Not on file  Tobacco Use  . Smoking status: Former Research scientist (life sciences)  . Smokeless tobacco: Never Used  Substance and Sexual Activity  . Alcohol use: Yes    Comment: occ  . Drug use: No  . Sexual activity: Not Currently  Other Topics Concern  . Not on file  Social History Narrative   G1P1   Married.   Recently moved here from [place].            Social Determinants of Health   Financial Resource Strain:   . Difficulty of Paying Living Expenses: Not on file  Food Insecurity:   . Worried About Charity fundraiser in  the Last Year: Not on file  . Ran Out of Food in the Last Year: Not on file  Transportation Needs:   . Lack of Transportation (Medical): Not on file  . Lack of Transportation (Non-Medical): Not on file  Physical Activity:   . Days of Exercise per Week: Not on file  . Minutes of Exercise per Session: Not on file  Stress:   . Feeling of Stress : Not on file  Social Connections:   . Frequency of Communication with Friends and Family: Not on file  . Frequency of Social Gatherings with Friends and Family: Not on file  . Attends Religious Services: Not on file  . Active Member of Clubs or Organizations: Not on file  . Attends Archivist Meetings: Not on file  . Marital Status: Not on file  Intimate Partner Violence:   . Fear of Current or Ex-Partner: Not on file  . Emotionally Abused: Not on file  . Physically Abused: Not on file  . Sexually Abused: Not on file    Outpatient Medications Prior to Visit  Medication Sig Dispense Refill  .  aspirin EC 81 MG tablet Take 81 mg by mouth daily.    Marland Kitchen azelastine (ASTELIN) 0.1 % nasal spray Place 2 sprays into both nostrils at bedtime as needed for rhinitis. 30 mL 6  . Cholecalciferol (VITAMIN D3 PO) Take by mouth.    . diclofenac (VOLTAREN) 75 MG EC tablet Take by mouth.    . fluticasone (FLONASE) 50 MCG/ACT nasal spray PLACE 2 SPRAYS INTO BOTH NOSTRILS DAILY. 16 g 11  . Multiple Vitamins-Minerals (VITAMIN D3 COMPLETE PO) Take by mouth.    . rosuvastatin (CRESTOR) 5 MG tablet Take 1 tablet (5 mg total) by mouth daily. 90 tablet 3  . TROKENDI XR 50 MG CP24 Take 50 mg by mouth daily.    Marland Kitchen azithromycin (ZITHROMAX) 250 MG tablet Take 2 tablets ( total 500 mg) PO on day 1, then take 1 tablet ( total 250 mg) by mouth q24h x 4 days. 6 tablet 0  . Cyanocobalamin (B-12) 1000 MCG CAPS Take by mouth.     No facility-administered medications prior to visit.    Allergies  Allergen Reactions  . Augmentin [Amoxicillin-Pot Clavulanate]     Insomnia/headache /nausea. Able to take PCN and Amox.  . Gabapentin Nausea Only    Review of Systems  Constitutional: Negative for chills and fever.  HENT: Negative.   Respiratory: Negative.   Cardiovascular: Negative.   Endocrine: Negative.   Genitourinary: Negative.   Musculoskeletal: Positive for arthralgias.      Objective:    Physical Exam Vitals reviewed.  Constitutional:      Appearance: Normal appearance.  Musculoskeletal:        General: Swelling and tenderness present.     Right hand: Swelling and bony tenderness present. Normal capillary refill. Normal pulse.     Left hand: Swelling and bony tenderness present. Decreased range of motion. Normal capillary refill. Normal pulse.     Cervical back: Normal range of motion and neck supple.     Comments: Swelling and tenderness over 1-2-3- 4 MCP joints left and right hands. No erythema or warmth. Unable to make a fist with left hand d/t swelling. No swelling or pain DIP or PIP joints. Wrist  and elbow and shoulder are WNL.   Skin:    General: Skin is warm and dry.  Neurological:     Mental Status: She is alert.  BP 124/80 (BP Location: Left Arm, Patient Position: Sitting, Cuff Size: Normal)   Pulse 63   Temp 97.6 F (36.4 C) (Oral)   Ht '5\' 8"'  (1.727 m)   Wt 211 lb (95.7 kg)   SpO2 97%   BMI 32.08 kg/m  Wt Readings from Last 3 Encounters:  02/14/20 211 lb (95.7 kg)  01/23/20 211 lb (95.7 kg)  08/09/19 206 lb 12.8 oz (93.8 kg)   Pulse Readings from Last 3 Encounters:  02/14/20 63  01/23/20 68  08/09/19 63    BP Readings from Last 3 Encounters:  02/14/20 124/80  01/23/20 128/72  08/09/19 124/78    Lab Results  Component Value Date   CHOL 139 01/23/2020   HDL 60.60 01/23/2020   LDLCALC 67 01/23/2020   LDLDIRECT 147.8 10/02/2012   TRIG 55.0 01/23/2020   CHOLHDL 2 01/23/2020     Health Maintenance Due  Topic Date Due  . HIV Screening  Never done    There are no preventive care reminders to display for this patient.  Lab Results  Component Value Date   TSH 0.96 06/13/2019   Lab Results  Component Value Date   WBC 5.2 06/13/2019   HGB 12.9 06/13/2019   HCT 39.3 06/13/2019   MCV 96.3 06/13/2019   PLT 215.0 06/13/2019   Lab Results  Component Value Date   NA 140 01/23/2020   K 3.6 01/23/2020   CO2 24 01/23/2020   GLUCOSE 111 (H) 01/23/2020   BUN 15 01/23/2020   CREATININE 0.70 01/23/2020   BILITOT 0.7 01/23/2020   ALKPHOS 105 01/23/2020   AST 12 01/23/2020   ALT 11 01/23/2020   PROT 6.3 01/23/2020   ALBUMIN 3.8 01/23/2020   CALCIUM 9.0 01/23/2020   GFR 91.26 01/23/2020   Lab Results  Component Value Date   CHOL 139 01/23/2020   Lab Results  Component Value Date   HDL 60.60 01/23/2020   Lab Results  Component Value Date   LDLCALC 67 01/23/2020   Lab Results  Component Value Date   TRIG 55.0 01/23/2020   Lab Results  Component Value Date   CHOLHDL 2 01/23/2020   Lab Results  Component Value Date   HGBA1C 4.9  06/13/2019      Assessment & Plan:   Problem List Items Addressed This Visit      Other   Bilateral hand pain - Primary     Localized swelling of both hands  Her MCP joint exam look like new onset RA.   Please go to lab today to check ESR, CRP, RA, ANA  Referral placed to Rheumatology  Please take the Diclofenac as directed.  Follow-up: Return if symptoms worsen or fail to improve.  This visit occurred during the SARS-CoV-2 public health emergency.  Safety protocols were in place, including screening questions prior to the visit, additional usage of staff PPE, and extensive cleaning of exam room while observing appropriate contact time as indicated for disinfecting solutions.    Denice Paradise, NP

## 2020-02-16 ENCOUNTER — Encounter: Payer: Self-pay | Admitting: Nurse Practitioner

## 2020-02-17 ENCOUNTER — Telehealth: Payer: Self-pay

## 2020-02-17 LAB — RHEUMATOID FACTOR: Rheumatoid fact SerPl-aCnc: 191 IU/mL — ABNORMAL HIGH (ref <14)

## 2020-02-17 LAB — ANA: Anti Nuclear Antibody (ANA): NEGATIVE

## 2020-02-17 MED ORDER — PREDNISONE 5 MG PO TABS
ORAL_TABLET | ORAL | 0 refills | Status: DC
Start: 2020-02-17 — End: 2022-03-29

## 2020-02-17 NOTE — Telephone Encounter (Signed)
Patient aware of results and has an appt with Jefm Bryant this week. Patient is also aware of rx at pharmacy.

## 2020-02-17 NOTE — Telephone Encounter (Signed)
Patient returned office phone call. Patient is at work and stated ok to leave a detailed message on phone.

## 2020-02-17 NOTE — Telephone Encounter (Signed)
LMTCB

## 2020-02-17 NOTE — Addendum Note (Signed)
Addended by: Denice Paradise A on: 02/17/2020 08:21 AM   Modules accepted: Orders

## 2020-02-17 NOTE — Telephone Encounter (Signed)
-----   Message from Marval Regal, NP sent at 02/17/2020  8:21 AM EST ----- Please call her with positive Rheumatoid factor. I spoke with Rheumatologist- Dr. Jefm Bryant who is on call today and he will try to work her in ASAP. He does recommend a prednisone 5 mg 6 day taper. This will help with pain and swelling.

## 2020-04-09 ENCOUNTER — Ambulatory Visit: Payer: Self-pay | Admitting: Urology

## 2020-04-17 ENCOUNTER — Ambulatory Visit (INDEPENDENT_AMBULATORY_CARE_PROVIDER_SITE_OTHER): Payer: No Typology Code available for payment source | Admitting: Urology

## 2020-04-17 ENCOUNTER — Other Ambulatory Visit
Admission: RE | Admit: 2020-04-17 | Discharge: 2020-04-17 | Disposition: A | Discharge status: Home / Self Care | Payer: BLUE CROSS/BLUE SHIELD | Attending: Urology | Admitting: Urology

## 2020-04-17 ENCOUNTER — Other Ambulatory Visit: Payer: Self-pay

## 2020-04-17 VITALS — BP 133/76 | HR 61 | Ht 67.0 in | Wt 211.0 lb

## 2020-04-17 DIAGNOSIS — N362 Urethral caruncle: Secondary | ICD-10-CM | POA: Diagnosis present

## 2020-04-17 LAB — URINALYSIS, COMPLETE (UACMP) WITH MICROSCOPIC
Bilirubin Urine: NEGATIVE
Glucose, UA: NEGATIVE mg/dL
Hgb urine dipstick: NEGATIVE
Ketones, ur: NEGATIVE mg/dL
Nitrite: POSITIVE — AB
Protein, ur: NEGATIVE mg/dL
Specific Gravity, Urine: 1.025 (ref 1.005–1.030)
Squamous Epithelial / HPF: NONE SEEN (ref 0–5)
pH: 7 (ref 5.0–8.0)

## 2020-04-17 NOTE — Progress Notes (Signed)
04/17/2020 2:07 PM   Ariana Lopez 09-20-1955 [id number]  Referring provider: Marval Regal, NP No address on file  Chief Complaint  Patient presents with   Other    Urethral caruncle     HPI: 65 year old female presents today for further evaluation of possible urethral caruncle.  She was seen and evaluated by her primary care physician in October 2021 at which time she was noted to have an incidental finding on pelvic exam.  Interestingly, there is no pelvic exam documenting this note however she was diagnosed with urethral caruncle and referred to urology.  She denies any urinary symptoms.  She has no burning, bleeding, or any other symptoms.  She does have a personal history of breast cancer plus years ago.  She underwent chemo with radiation and not had a recurrence.   PMH: Past Medical History:  Diagnosis Date   Allergy    Breast cancer ([place])    Breast cancer, left ([place]) 2005   Lumpectomy and sentinel lymph nodes resected, chemo + rad tx's.    History of thyroid nodule    History of tinnitus    Low vitamin D level 07/21/2019   Obesity (BMI 30.0-34.9) 11/13/2012   Personal history of chemotherapy    Personal history of radiation therapy    Sleep apnea    Thyroid nodule 07/21/2019    Surgical History: Past Surgical History:  Procedure Laterality Date   BREAST BIOPSY     BREAST LUMPECTOMY Left    BREAST SURGERY     CESAREAN SECTION  2000   COLONOSCOPY WITH PROPOFOL N/A 06/10/2016   Procedure: COLONOSCOPY WITH PROPOFOL;  Surgeon: Jonathon Bellows, MD;  Location: [facility] ENDOSCOPY;  Service: Endoscopy;  Laterality: N/A;   left breast lumpectomy Left 2002    Home Medications:  Allergies as of 04/17/2020      Reactions   Augmentin [amoxicillin-pot Clavulanate]    Insomnia/headache /nausea. Able to take PCN and Amox.   Gabapentin Nausea Only      Medication List       Accurate as of April 17, 2020  2:07 PM. If you have any questions, ask  your nurse or doctor.        STOP taking these medications   VITAMIN D3 COMPLETE PO Stopped by: Hollice Espy, MD     TAKE these medications   aspirin EC 81 MG tablet Take 81 mg by mouth daily.   azelastine 0.1 % nasal spray Commonly known as: ASTELIN Place 2 sprays into both nostrils at bedtime as needed for rhinitis.   diclofenac 75 MG EC tablet Commonly known as: VOLTAREN Take by mouth.   fluticasone 50 MCG/ACT nasal spray Commonly known as: FLONASE PLACE 2 SPRAYS INTO BOTH NOSTRILS DAILY.   folic acid 1 MG tablet Commonly known as: FOLVITE Take 1 mg by mouth daily.   methotrexate 2.5 MG tablet Commonly known as: RHEUMATREX Take 20 mg by mouth once a week.   omeprazole 20 MG capsule Commonly known as: PRILOSEC Take 20 mg by mouth daily.   predniSONE 5 MG tablet Commonly known as: DELTASONE Take 6 tablets (total 30 mg ) today and decrease by one tablet (5 mg) daily unitl off. This is a 6 day taper.   rosuvastatin 5 MG tablet Commonly known as: Crestor Take 1 tablet (5 mg total) by mouth daily.   Trokendi XR 50 MG Cp24 Generic drug: Topiramate ER Take 50 mg by mouth daily.   VITAMIN D3 PO Take by mouth.  Allergies:  Allergies  Allergen Reactions   Augmentin [Amoxicillin-Pot Clavulanate]     Insomnia/headache /nausea. Able to take PCN and Amox.   Gabapentin Nausea Only    Family History: Family History  Problem Relation Age of Onset   Cancer Sister 32       breast   Breast cancer Sister    Stroke Mother    Heart disease Father    Hypertension Father    Heart attack Brother     Social History:  reports that she has quit smoking. She has never used smokeless tobacco. She reports current alcohol use. She reports that she does not use drugs.   Physical Exam: BP 133/76    Pulse 61    Ht 5\' 7"  (1.702 m)    Wt 211 lb (95.7 kg)    BMI 33.05 kg/m   Constitutional:  Alert and oriented, No acute distress. HEENT: [place] AT, moist mucus  membranes.  Trachea midline, no masses. Cardiovascular: No clubbing, cyanosis, or edema. Respiratory: Normal respiratory effort, no increased work of breathing. Pelvic: Chaperoned by CMA Alise.  Normal external genitalia.  Urethral meatus somewhat atrophic in appearance with circumferential bulging inside of the urethral os which is slightly hyperemic.  This does not appear to be arising specifically from the 6 o'clock position.  When examined bimanually, the urethra is nontender, unable to express, nonpalpable. Skin: No rashes, bruises or suspicious lesions. Neurologic: Grossly intact, no focal deficits, moving all 4 extremities. Psychiatric: Normal mood and affect.  Laboratory Data: Lab Results  Component Value Date   WBC 5.2 06/13/2019   HGB 12.9 06/13/2019   HCT 39.3 06/13/2019   MCV 96.3 06/13/2019   PLT 215.0 06/13/2019    Lab Results  Component Value Date   CREATININE 0.70 01/23/2020    Lab Results  Component Value Date   HGBA1C 4.9 06/13/2019    Urinalysis    Component Value Date/Time   COLORURINE YELLOW 04/17/2020 1004   APPEARANCEUR CLEAR 04/17/2020 1004   LABSPEC 1.025 04/17/2020 1004   PHURINE 7.0 04/17/2020 1004   GLUCOSEU NEGATIVE 04/17/2020 1004   HGBUR NEGATIVE 04/17/2020 1004   BILIRUBINUR NEGATIVE 04/17/2020 1004   BILIRUBINUR negative 08/19/2016 1511   KETONESUR NEGATIVE 04/17/2020 1004   PROTEINUR NEGATIVE 04/17/2020 1004   UROBILINOGEN 0.2 08/19/2016 1511   NITRITE POSITIVE (A) 04/17/2020 1004   LEUKOCYTESUR SMALL (A) 04/17/2020 1004    Lab Results  Component Value Date   BACTERIA MANY (A) 04/17/2020   Assessment & Plan:    1. Urethral lesion Irregular appearance of urethra with some periurethral atrophy and a lesion within the urethra which does not have the typical appearance of a carbuncle.  We discussed the differential diagnosis for this lesion, may represent some mild urethral prolapse, atypical appearing urethral caruncle which may in  fact arise from the 6 o'clock position but is larger more circumferential, or possibly even very slow malignancy/tumor.  I recommended office cystoscopy for further evaluation.  We discussed the procedure at length.  She is agreeable this plan.  Although she is asymptomatic, her urine today was somewhat suspicious for chronic bacterial colonization.  We will send off culture given her upcoming cystoscopy and treat as needed prior if there is in fact any bacteriuria.  - Urinalysis, Complete w Microscopic (For [facility] ONLY); Future -Urine culture  No follow-ups on file.  Hollice Espy, MD  [facility] [address] [place], [place] [address] [phone]

## 2020-04-19 LAB — URINE CULTURE
Culture: >=100000 — AB
Special Requests: NORMAL

## 2020-04-21 ENCOUNTER — Telehealth: Payer: Self-pay | Admitting: *Deleted

## 2020-04-21 MED ORDER — SULFAMETHOXAZOLE-TRIMETHOPRIM 800-160 MG PO TABS
ORAL_TABLET | ORAL | 0 refills | Status: DC
Start: 2020-04-21 — End: 2020-05-07

## 2020-04-21 NOTE — Telephone Encounter (Addendum)
Patient informed, voiced understanding.   ----- Message from Hollice Espy, MD sent at 04/21/2020  8:04 AM EST ----- This patient came to our office in [facility] with an asymptomatic urethral lesion.  Her urine was somewhat suspicious for infection and ended up growing E. coli.  Because she is going to be having upcoming cystoscopy, would like to treat her for a few days before the procedure with antibiotic to reduce the risk of any infectious complications.  Normally, we would not treat this.  Please send her Bactrim DS twice daily to start 3 days before the cystoscopy for a total of 5-day course.   Hollice Espy, MD

## 2020-04-23 ENCOUNTER — Ambulatory Visit (INDEPENDENT_AMBULATORY_CARE_PROVIDER_SITE_OTHER): Payer: No Typology Code available for payment source

## 2020-04-23 ENCOUNTER — Other Ambulatory Visit: Payer: Self-pay

## 2020-04-23 DIAGNOSIS — Z23 Encounter for immunization: Secondary | ICD-10-CM | POA: Diagnosis not present

## 2020-04-23 NOTE — Progress Notes (Signed)
Patient presented for shingrix injection to right deltoid, patient voiced no concerns nor showed any signs of distress during injection. 

## 2020-05-07 ENCOUNTER — Ambulatory Visit: Payer: No Typology Code available for payment source | Admitting: Urology

## 2020-05-07 ENCOUNTER — Encounter: Payer: Self-pay | Admitting: Urology

## 2020-05-07 ENCOUNTER — Other Ambulatory Visit: Payer: Self-pay

## 2020-05-07 VITALS — BP 136/69 | HR 80 | Ht 67.0 in

## 2020-05-07 DIAGNOSIS — N362 Urethral caruncle: Secondary | ICD-10-CM

## 2020-05-07 DIAGNOSIS — R3915 Urgency of urination: Secondary | ICD-10-CM

## 2020-05-07 LAB — URINALYSIS, COMPLETE
Bilirubin, UA: NEGATIVE
Glucose, UA: NEGATIVE
Ketones, UA: NEGATIVE
Nitrite, UA: NEGATIVE
Protein,UA: NEGATIVE
RBC, UA: NEGATIVE
Specific Gravity, UA: >1.03 — ABNORMAL HIGH (ref 1.005–1.030)
Urobilinogen, Ur: 0.2 mg/dL (ref 0.2–1.0)
pH, UA: 6 (ref 5.0–7.5)

## 2020-05-07 LAB — MICROSCOPIC EXAMINATION

## 2020-05-07 MED ORDER — MIRABEGRON ER 25 MG PO TB24
25.0000 mg | ORAL_TABLET | Freq: Every day | ORAL | 0 refills | Status: DC
Start: 2020-05-07 — End: 2022-03-29

## 2020-05-07 NOTE — Progress Notes (Signed)
   05/07/20  CC:  Chief Complaint  Patient presents with  . Cysto    HPI: 65 year old female with urethral lesion who presents today for cystoscopic evaluation.  Notably, constricts she continues to have a small amount of urinary frequency and urgency.  She was treated for a UTI at the time of last visit but is not made any impact on her symptoms.  Blood pressure 136/69, pulse 80, height 5\' 7"  (1.702 m). NED. A&Ox3.   No respiratory distress   Abd soft, NT, ND Normal external genitalia with patent urethral meatus  Cystoscopy Procedure Note  Patient identification was confirmed, informed consent was obtained, and patient was prepped using Betadine solution.  Lidocaine jelly was administered per urethral meatus.    Procedure: - Flexible cystoscope introduced, without any difficulty.   - Thorough search of the bladder revealed:    Approximately 2 mm lesion at the 6 o'clock position of the urethra extending up towards the 7 o'clock position which visually appears to be consistent with urethral caruncle just within the urethral meatus and does not extend much externally    normal urothelium    no stones    no ulcers     no tumors    no urethral polyps    no trabeculation  - Ureteral orifices were normal in position and appearance.  Slight trigonitis is appreciated.  Post-Procedure: - Patient tolerated the procedure well  Assessment/ Plan:  1. Urethral caruncle Visually, the urethral lesion is most consistent with urethral caruncle though more internally located than typical on the outer 6 o'clock position  Cystoscopy otherwise unremarkable  We will send urine cytology today  Given that slightly unusual appearance of this lesion and slightly atypical location, would like to reimage this one additional time about 6 months to ensure that has not changed, she is agreeable this plan - Urinalysis, Complete - Cytology - Non PAP;  2. Urinary urgency Symptomatic urinary  urgency/frequency  She is given samples of Myrbetriq 25 mg daily to try, let us know if this is effective.  Prefers to avoid anticholinergics given dry eyes and dry mouth and constipation side effects which she already has.  F/u cysto in 6 month      No follow-ups on file.  Hollice Espy, MD

## 2020-05-08 LAB — CYTOLOGY - NON PAP

## 2020-11-04 ENCOUNTER — Other Ambulatory Visit: Payer: Self-pay | Admitting: Urology

## 2020-11-17 ENCOUNTER — Telehealth: Payer: Self-pay

## 2020-11-17 NOTE — Telephone Encounter (Signed)
Patient called to cancel cystoscopy appointment. She states she has other medical issues going on that she needs to take care of first

## 2020-11-19 ENCOUNTER — Other Ambulatory Visit: Payer: Self-pay | Admitting: Family Medicine

## 2020-11-19 ENCOUNTER — Other Ambulatory Visit: Payer: Self-pay | Admitting: Urology

## 2020-11-19 DIAGNOSIS — Z1231 Encounter for screening mammogram for malignant neoplasm of breast: Secondary | ICD-10-CM

## 2020-12-03 ENCOUNTER — Ambulatory Visit
Admission: RE | Admit: 2020-12-03 | Discharge: 2020-12-03 | Disposition: A | Discharge status: Home / Self Care | Payer: Medicare Other | Source: Ambulatory Visit | Attending: Family Medicine | Admitting: Family Medicine

## 2020-12-03 ENCOUNTER — Other Ambulatory Visit: Payer: Self-pay

## 2020-12-03 DIAGNOSIS — Z1231 Encounter for screening mammogram for malignant neoplasm of breast: Secondary | ICD-10-CM | POA: Diagnosis not present

## 2020-12-09 ENCOUNTER — Other Ambulatory Visit: Payer: Self-pay | Admitting: Family Medicine

## 2020-12-09 DIAGNOSIS — N6489 Other specified disorders of breast: Secondary | ICD-10-CM

## 2020-12-09 DIAGNOSIS — R928 Other abnormal and inconclusive findings on diagnostic imaging of breast: Secondary | ICD-10-CM

## 2020-12-17 ENCOUNTER — Ambulatory Visit
Admission: RE | Admit: 2020-12-17 | Discharge: 2020-12-17 | Disposition: A | Discharge status: Home / Self Care | Payer: Medicare Other | Source: Ambulatory Visit | Attending: Family Medicine | Admitting: Family Medicine

## 2020-12-17 ENCOUNTER — Other Ambulatory Visit: Payer: Self-pay

## 2020-12-17 DIAGNOSIS — R928 Other abnormal and inconclusive findings on diagnostic imaging of breast: Secondary | ICD-10-CM | POA: Insufficient documentation

## 2020-12-17 DIAGNOSIS — N6489 Other specified disorders of breast: Secondary | ICD-10-CM

## 2020-12-24 ENCOUNTER — Encounter: Payer: Self-pay | Admitting: Occupational Therapy

## 2020-12-24 ENCOUNTER — Ambulatory Visit: Payer: Medicare Other | Attending: Rheumatology | Admitting: Occupational Therapy

## 2020-12-24 DIAGNOSIS — M79642 Pain in left hand: Secondary | ICD-10-CM | POA: Diagnosis present

## 2020-12-24 DIAGNOSIS — M6530 Trigger finger, unspecified finger: Secondary | ICD-10-CM | POA: Insufficient documentation

## 2020-12-24 DIAGNOSIS — M25642 Stiffness of left hand, not elsewhere classified: Secondary | ICD-10-CM | POA: Insufficient documentation

## 2020-12-24 NOTE — Therapy (Signed)
[facility] [address], [place], [address] Phone: [phone]   Fax:  [phone]  Occupational Therapy Evaluation  Patient Details  Name: Ariana Lopez MRN: [id number] Date of Birth: 09-17-1955 Referring Provider (OT): DR Jefm Bryant   Encounter Date: 12/24/2020   OT End of Session - 12/24/20 1438     Visit Number 1    Number of Visits 8    Date for OT Re-Evaluation 02/04/21    OT Start Time 2751    OT Stop Time 1115    OT Time Calculation (min) 40 min    Activity Tolerance Patient tolerated treatment well    Behavior During Therapy [facility] for tasks assessed/performed             Past Medical History:  Diagnosis Date   Allergy    Breast cancer ([place])    Breast cancer, left ([place]) 2005   Lumpectomy and sentinel lymph nodes resected, chemo + rad tx's.    History of thyroid nodule    History of tinnitus    Low vitamin D level 07/21/2019   Obesity (BMI 30.0-34.9) 11/13/2012   Personal history of chemotherapy    Personal history of radiation therapy    Sleep apnea    Thyroid nodule 07/21/2019    Past Surgical History:  Procedure Laterality Date   BREAST BIOPSY     BREAST LUMPECTOMY Left    BREAST SURGERY     CESAREAN SECTION  2000   COLONOSCOPY WITH PROPOFOL N/A 06/10/2016   Procedure: COLONOSCOPY WITH PROPOFOL;  Surgeon: Jonathon Bellows, MD;  Location: [facility] ENDOSCOPY;  Service: Endoscopy;  Laterality: N/A;   left breast lumpectomy Left 2002    There were no vitals filed for this visit.   Subjective Assessment - 12/24/20 1430     Subjective  My L hand is worse than the R -middle finger the worse and in the morning triggering - and index gets stiff when driving after work - pain over the knuckles - I work Sun thru Wed at [employer] 10 hour days- with only 2 x 15 min break and one 30 min break    Pertinent History DR Jefm Bryant Assessment:  Rheumatoid arthritis, still titrating Cimzia. Methotrexate   -Sudden onset  -Positive rheumatoid factor and anti-CCP antibody  -Prednisone  Pruritus. Mild elevation of alk phos    -Methotrexate  Breast cancer  -Radiation  -Chemotherapy  Thyroid cyst  GI upset. May be meds. Does not correlate with methotrexate dosing  Left second and third third flexor tendon nodule       Plan:  Offered to inject tendon nodules. Declined. Will have hand therapy see. Prednisone taper. Stay with Cimzia for now  antihistamine over-the-counter to see if it helps with itching  See back 2 to 3 months. Methotrexate labs prior along with antimitochondrial antibody anti-smooth muscle antibody given her elevated alk phos    Patient Stated Goals Want to get my L hand stiffness and pain better so I can use it with more ease    Currently in Pain? Yes    Pain Score 4     Pain Location Hand    Pain Orientation Left    Pain Descriptors / Indicators Aching;Tightness;Tender               [facility] OT Assessment - 12/24/20 0001       Assessment   Medical Diagnosis RA with trigger nodules and stiffness    Referring Provider (  OT) DR Jefm Bryant    Onset Date/Surgical Date 03/28/20    Hand Dominance Right    Next MD Visit Oct 6th      Home  Environment   Lives With Spouse      Prior Function   Vocation Full time employment    Leisure doing packages 4x10 hrs at [employer] , house work and cooking      AROM   Right Wrist Extension 62 Degrees    Right Wrist Flexion 85 Degrees    Left Wrist Extension 70 Degrees    Left Wrist Flexion 85 Degrees      Strength   Right Hand Grip (lbs) 48    Right Hand Lateral Pinch 17 lbs    Right Hand 3 Point Pinch 11 lbs    Left Hand Grip (lbs) 40    Left Hand Lateral Pinch 13 lbs    Left Hand 3 Point Pinch 10 lbs      Right Hand AROM   R Index  MCP 0-90 80 Degrees    R Index PIP 0-100 100 Degrees    R Long  MCP 0-90 85 Degrees    R Long PIP 0-100 100 Degrees    R Ring  MCP 0-90 90 Degrees    R Ring PIP 0-100 100 Degrees    R Little  MCP 0-90 90  Degrees    R Little PIP 0-100 100 Degrees      Left Hand AROM   L Index  MCP 0-90 75 Degrees    L Index PIP 0-100 100 Degrees    L Long  MCP 0-90 75 Degrees    L Long PIP 0-100 100 Degrees    L Ring  MCP 0-90 80 Degrees    L Ring PIP 0-100 100 Degrees    L Little  MCP 0-90 85 Degrees    L Little PIP 0-100 95 Degrees                      OT Treatments/Exercises (OP) - 12/24/20 0001       LUE Contrast Bath   Time 8 minutes    Comments L hand prior to review of HEP                    OT Education - 12/24/20 1438     Education Details findings of eval and HEP / joint protection    Person(s) Educated Patient    Methods Explanation;Demonstration;Tactile cues;Verbal cues;Handout    Comprehension Verbal cues required;Returned demonstration;Verbalized understanding                 OT Long Term Goals - 12/24/20 1935       OT LONG TERM GOAL #1   Title Pt to be independent in HEP to decrease tenderness and pain over MC's of L hand    Baseline Tender over 2nd and 3rd A1pulley of L hand , 5th R - Pain over MC's with fisting    Time 4    Period Weeks    Status New    Target Date 01/21/21      OT LONG TERM GOAL #2   Title Pt report decrease triggering of 3rd digit on L hand in the am - to less than 3 episodes a week    Baseline trigger every am -and stiff - pain over MC's iwth fisting    Time 6    Period Weeks    Status New  Target Date 02/04/21      OT LONG TERM GOAL #3   Title Pt verbalize 3 joint protection and AE she use to decrease pain in hands and triggering    Baseline no knowledge    Time 6    Period Weeks    Status New    Target Date 02/04/21                   Plan - 12/24/20 1440     Clinical Impression Statement Pt refer with diagosis of RA with hand stiffness and trigger finger nodules L hand worse than R - Pt is R hand dominant and 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- pt show decrease composite  flexion in L hand more than R , tenderness over 2nd and 3rd A1pulley and R over 5th - report triggering mostly in the morning- pain with gripping and making fist over the MC's - increase to 5/10 - decrease grip more than prehension for her age - Pain , stiffness and muscle weakness limiting her functional use of L hand in ADL's and IADL's - pt can benefit from skilled OT services - joint protectoin and AE trng initiated this date and HEP for contrast and heat in the am with AROM pain free    OT Occupational Profile and History Problem Focused Assessment - Including review of records relating to presenting problem    Occupational performance deficits (Please refer to evaluation for details): IADL's;ADL's;Work;Play;Leisure    Body Structure / Function / Physical Skills ADL;Strength;Pain;UE functional use;IADL;ROM;Flexibility;Decreased knowledge of use of DME;Edema    Rehab Potential Fair    Clinical Decision Making Limited treatment options, no task modification necessary    Comorbidities Affecting Occupational Performance: May have comorbidities impacting occupational performance   RA in hands   Modification or Assistance to Complete Evaluation  No modification of tasks or assist necessary to complete eval    OT Frequency 1x / week    OT Duration 8 weeks    OT Treatment/Interventions Self-care/ADL training;Paraffin;Fluidtherapy;Contrast Bath;Therapeutic exercise;Manual Therapy;Patient/family education;DME and/or AE instruction    Consulted and Agree with Plan of Care Patient             Patient will benefit from skilled therapeutic intervention in order to improve the following deficits and impairments:   Body Structure / Function / Physical Skills: ADL, Strength, Pain, UE functional use, IADL, ROM, Flexibility, Decreased knowledge of use of DME, Edema       Visit Diagnosis: Stiffness of left hand, not elsewhere classified - Plan: Ot plan of care cert/re-cert  Pain in left hand - Plan: Ot  plan of care cert/re-cert  Trigger finger of left hand, unspecified finger - Plan: Ot plan of care cert/re-cert    Problem List Patient Active Problem List   Diagnosis Date Noted   Bilateral hand pain 02/14/2020   Hyperlipidemia 01/23/2020   Acute otitis media 01/23/2020   Right ear pain 01/23/2020   Vitamin D deficiency 08/09/2019   Urethral caruncle 07/21/2019   Thyroid nodule 07/21/2019   Low vitamin D level 07/21/2019   History of tinnitus    Pruritus 05/19/2016   OSA (obstructive sleep apnea) 02/10/2016   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 [place], [address] Phone: [phone]   Fax:  [phone]  Name: Ariana Lopez MRN: [id number] Date of Birth: 1955/10/26

## 2021-01-14 ENCOUNTER — Ambulatory Visit: Payer: Medicare Other | Admitting: Occupational Therapy

## 2021-01-19 ENCOUNTER — Ambulatory Visit: Payer: Medicare Other | Attending: Rheumatology | Admitting: Occupational Therapy

## 2021-01-19 DIAGNOSIS — M6530 Trigger finger, unspecified finger: Secondary | ICD-10-CM | POA: Diagnosis present

## 2021-01-19 DIAGNOSIS — M25642 Stiffness of left hand, not elsewhere classified: Secondary | ICD-10-CM

## 2021-01-19 DIAGNOSIS — M79642 Pain in left hand: Secondary | ICD-10-CM | POA: Diagnosis present

## 2021-01-19 NOTE — Therapy (Signed)
[facility] [address], [place], [address] Phone: [phone]   Fax:  [phone]  Occupational Therapy Treatment  Patient Details  Name: Ariana Lopez MRN: [id number] Date of Birth: 1955/10/18 Referring Provider (OT): DR Jefm Bryant   Encounter Date: 01/19/2021   OT End of Session - 01/19/21 1103     Visit Number 2    Number of Visits 82    Date for OT Re-Evaluation 01/19/21    OT Start Time 1030    OT Stop Time 1050    OT Time Calculation (min) 20 min    Activity Tolerance Patient tolerated treatment well    Behavior During Therapy [facility] for tasks assessed/performed             Past Medical History:  Diagnosis Date   Allergy    Breast cancer ([place])    Breast cancer, left ([place]) 2005   Lumpectomy and sentinel lymph nodes resected, chemo + rad tx's.    History of thyroid nodule    History of tinnitus    Low vitamin D level 07/21/2019   Obesity (BMI 30.0-34.9) 11/13/2012   Personal history of chemotherapy    Personal history of radiation therapy    Sleep apnea    Thyroid nodule 07/21/2019    Past Surgical History:  Procedure Laterality Date   BREAST BIOPSY     BREAST LUMPECTOMY Left    BREAST SURGERY     CESAREAN SECTION  2000   COLONOSCOPY WITH PROPOFOL N/A 06/10/2016   Procedure: COLONOSCOPY WITH PROPOFOL;  Surgeon: Jonathon Bellows, MD;  Location: [facility] ENDOSCOPY;  Service: Endoscopy;  Laterality: N/A;   left breast lumpectomy Left 2002    There were no vitals filed for this visit.   Subjective Assessment - 01/19/21 1101     Subjective  The exercises really helped- I am on vacation at them moment -but I use only hot water - not the ice inbetween and then the exercises and no pain - just stiffness when making tight fist -but no triggering anymore in the morning    Pertinent History DR Jefm Bryant Assessment:  Rheumatoid arthritis, still titrating Cimzia. Methotrexate  -Sudden onset  -Positive  rheumatoid factor and anti-CCP antibody  -Prednisone  Pruritus. Mild elevation of alk phos    -Methotrexate  Breast cancer  -Radiation  -Chemotherapy  Thyroid cyst  GI upset. May be meds. Does not correlate with methotrexate dosing  Left second and third third flexor tendon nodule       Plan:  Offered to inject tendon nodules. Declined. Will have hand therapy see. Prednisone taper. Stay with Cimzia for now  antihistamine over-the-counter to see if it helps with itching  See back 2 to 3 months. Methotrexate labs prior along with antimitochondrial antibody anti-smooth muscle antibody given her elevated alk phos    Patient Stated Goals Want to get my L hand stiffness and pain better so I can use it with more ease    Currently in Pain? No/denies                [facility] OT Assessment - 01/19/21 0001       AROM   Right Wrist Extension 70 Degrees    Right Wrist Flexion 95 Degrees    Left Wrist Extension 70 Degrees    Left Wrist Flexion 95 Degrees      Strength   Right Hand Grip (lbs) 60    Right Hand Lateral Pinch 16 lbs  Right Hand 3 Point Pinch 14 lbs    Left Hand Grip (lbs) 56    Left Hand Lateral Pinch 14 lbs    Left Hand 3 Point Pinch 13 lbs      Right Hand AROM   R Index  MCP 0-90 85 Degrees    R Index PIP 0-100 100 Degrees    R Long  MCP 0-90 85 Degrees    R Long PIP 0-100 100 Degrees    R Ring  MCP 0-90 90 Degrees    R Ring PIP 0-100 100 Degrees    R Little  MCP 0-90 90 Degrees    R Little PIP 0-100 100 Degrees      Left Hand AROM   L Index  MCP 0-90 75 Degrees    L Index PIP 0-100 100 Degrees    L Long  MCP 0-90 80 Degrees    L Long PIP 0-100 100 Degrees    L Ring  MCP 0-90 85 Degrees    L Ring PIP 0-100 100 Degrees    L Little  MCP 0-90 90 Degrees    L Little PIP 0-100 100 Degrees             Great progress today - first visit since eval 2-3 wks ago  AROM WNL for wrist and digits- no pain and no tenderness over A1pulley's  Denies any triggering in the morning   Using heat and then doing tendon glides and opposition Grip and prehension increase in bilateral hands to WNL for her age  Pt to cont with same home program                  OT Education - 01/19/21 1102     Education Details progress and homeprogram /jont protection to cont with    Person(s) Educated Patient    Methods Explanation;Demonstration;Tactile cues;Verbal cues;Handout    Comprehension Verbal cues required;Returned demonstration;Verbalized understanding                 OT Long Term Goals - 01/19/21 1107       OT LONG TERM GOAL #1   Title Pt to be independent in HEP to decrease tenderness and pain over MC's of L hand    Baseline Tender over 2nd and 3rd A1pulley of L hand , 5th R - Pain over MC's with fisting   NOW AROM WNL and no pain only some stiffness- no tenderness over A1pulley's    Status Achieved      OT LONG TERM GOAL #2   Title Pt report decrease triggering of 3rd digit on L hand in the am - to less than 3 episodes a week    Baseline trigger every am -and stiff - pain over MC's iwth fisting  - NOW report no triggering in the morning- no tenderness over A1pulleys    Status Achieved      OT LONG TERM GOAL #3   Title Pt verbalize 3 joint protection and AE she use to decrease pain in hands and triggering    Baseline Has hand out and do use larger joints, heat , enlarge grips    Status Achieved                   Plan - 01/19/21 1104     Clinical Impression Statement Pt refer with diagosis of RA with hand stiffness and trigger finger nodules L hand worse than R - Pt is R hand dominant and 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- pt showed at eval decrease composite flexion in L hand more than R , tenderness over 2nd and 3rd A1pulley and R over 5th - report triggering mostly in the morning- pain with gripping and making fist over the MC's - increase to 5/10 - decrease grip more than prehension for her age -  Pt return this  date after doing HEP at home and modifications - and AROM in bilateral hands increase to WNL , no pain and report HEP "reallly works well" and has no triggering in the mornings- showed no tenderness this date over A1pulley's - Grip and prehension strength increase in bilateral hands and are now  WNL for her age - pt can be discharge at this time to cont wtih homeprogram.    OT Occupational Profile and History Problem Focused Assessment - Including review of records relating to presenting problem    Occupational performance deficits (Please refer to evaluation for details): IADL's;ADL's;Work;Play;Leisure    Body Structure / Function / Physical Skills ADL;Strength;Pain;UE functional use;IADL;ROM;Flexibility;Decreased knowledge of use of DME;Edema    Rehab Potential Fair    Clinical Decision Making Limited treatment options, no task modification necessary    Comorbidities Affecting Occupational Performance: May have comorbidities impacting occupational performance    Modification or Assistance to Complete Evaluation  No modification of tasks or assist necessary to complete eval    OT Treatment/Interventions Self-care/ADL training;Paraffin;Fluidtherapy;Contrast Bath;Therapeutic exercise;Manual Therapy;Patient/family education;DME and/or AE instruction    Consulted and Agree with Plan of Care Patient             Patient will benefit from skilled therapeutic intervention in order to improve the following deficits and impairments:   Body Structure / Function / Physical Skills: ADL, Strength, Pain, UE functional use, IADL, ROM, Flexibility, Decreased knowledge of use of DME, Edema       Visit Diagnosis: Stiffness of left hand, not elsewhere classified  Pain in left hand  Trigger finger of left hand, unspecified finger    Problem List Patient Active Problem List   Diagnosis Date Noted   Bilateral hand pain 02/14/2020   Hyperlipidemia 01/23/2020   Acute otitis media 01/23/2020   Right ear  pain 01/23/2020   Vitamin D deficiency 08/09/2019   Urethral caruncle 07/21/2019   Thyroid nodule 07/21/2019   Low vitamin D level 07/21/2019   History of tinnitus    Pruritus 05/19/2016   OSA (obstructive sleep apnea) 02/10/2016   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 [place], [address] Phone: [phone]   Fax:  [phone]  Name: LIESEL PECKENPAUGH MRN: [id number] Date of Birth: 06-07-1955

## 2021-02-25 ENCOUNTER — Other Ambulatory Visit: Payer: Self-pay | Admitting: Otolaryngology

## 2021-02-25 DIAGNOSIS — R29810 Facial weakness: Secondary | ICD-10-CM

## 2021-02-25 DIAGNOSIS — E041 Nontoxic single thyroid nodule: Secondary | ICD-10-CM

## 2021-03-11 ENCOUNTER — Other Ambulatory Visit: Payer: Self-pay

## 2021-03-11 ENCOUNTER — Ambulatory Visit
Admission: RE | Admit: 2021-03-11 | Discharge: 2021-03-11 | Disposition: A | Discharge status: Home / Self Care | Payer: Medicare Other | Source: Ambulatory Visit | Attending: Otolaryngology | Admitting: Otolaryngology

## 2021-03-11 DIAGNOSIS — E041 Nontoxic single thyroid nodule: Secondary | ICD-10-CM

## 2021-03-11 DIAGNOSIS — R29810 Facial weakness: Secondary | ICD-10-CM

## 2021-03-11 MED ORDER — GADOBUTROL 1 MMOL/ML IV SOLN
10.0000 mL | Freq: Once | INTRAVENOUS | Status: AC | PRN
  Administered 2021-03-11: 10 mL via INTRAVENOUS

## 2021-11-04 ENCOUNTER — Other Ambulatory Visit: Payer: Self-pay | Admitting: Family Medicine

## 2021-11-04 DIAGNOSIS — Z1231 Encounter for screening mammogram for malignant neoplasm of breast: Secondary | ICD-10-CM

## 2021-11-24 ENCOUNTER — Other Ambulatory Visit: Payer: Self-pay | Admitting: Otolaryngology

## 2021-11-24 DIAGNOSIS — E041 Nontoxic single thyroid nodule: Secondary | ICD-10-CM

## 2021-11-25 ENCOUNTER — Ambulatory Visit
Admission: RE | Admit: 2021-11-25 | Discharge: 2021-11-25 | Disposition: A | Discharge status: Home / Self Care | Payer: Medicare Other | Source: Ambulatory Visit | Attending: Otolaryngology | Admitting: Otolaryngology

## 2021-11-25 DIAGNOSIS — E041 Nontoxic single thyroid nodule: Secondary | ICD-10-CM

## 2021-12-06 ENCOUNTER — Ambulatory Visit
Admission: RE | Admit: 2021-12-06 | Discharge: 2021-12-06 | Disposition: A | Discharge status: Home / Self Care | Payer: Medicare Other | Source: Ambulatory Visit | Attending: Family Medicine | Admitting: Family Medicine

## 2021-12-06 DIAGNOSIS — Z1231 Encounter for screening mammogram for malignant neoplasm of breast: Secondary | ICD-10-CM | POA: Diagnosis present

## 2022-01-11 ENCOUNTER — Telehealth: Payer: Self-pay

## 2022-03-29 ENCOUNTER — Encounter
Admission: RE | Admit: 2022-03-29 | Discharge: 2022-03-29 | Disposition: A | Discharge status: Home / Self Care | Payer: Medicare Other | Source: Ambulatory Visit | Attending: Otolaryngology | Admitting: Otolaryngology

## 2022-03-29 ENCOUNTER — Other Ambulatory Visit: Payer: Self-pay

## 2022-03-29 DIAGNOSIS — Z01812 Encounter for preprocedural laboratory examination: Secondary | ICD-10-CM

## 2022-03-29 HISTORY — DX: Other specified postprocedural states: Z98.890

## 2022-03-29 HISTORY — DX: Headache, unspecified: R51.9

## 2022-03-29 HISTORY — DX: Unspecified asthma, uncomplicated: J45.909

## 2022-03-29 HISTORY — DX: Other specified postprocedural states: R11.2

## 2022-03-29 HISTORY — DX: Pneumonia, unspecified organism: J18.9

## 2022-03-29 HISTORY — DX: Unspecified osteoarthritis, unspecified site: M19.90

## 2022-03-29 HISTORY — DX: Other complications of anesthesia, initial encounter: T88.59XA

## 2022-03-29 NOTE — Patient Instructions (Addendum)
Your procedure is scheduled on: 04/05/22 - Tuesday Report to the Registration Desk on the 1st floor of the [facility]. To find out your arrival time, please call [phone] between 1PM - 3PM on: 04/04/22 - Monday If your arrival time is 6:00 am, do not arrive prior to that time as the [place] [facility] entrance doors do not open until 6:00 am.  REMEMBER: Instructions that are not followed completely may result in serious medical risk, up to and including death; or upon the discretion of your surgeon and anesthesiologist your surgery may need to be rescheduled.  Do not eat food after midnight the night before surgery.  No gum chewing, lozengers or hard candies.  You may however, drink CLEAR liquids up to 2 hours before you are scheduled to arrive for your surgery. Do not drink anything within 2 hours of your scheduled arrival time.  Clear liquids include: - water  - apple juice without pulp - gatorade (not RED colors) - black coffee or tea (Do NOT add milk or creamers to the coffee or tea) Do NOT drink anything that is not on this list.  TAKE THESE MEDICATIONS THE MORNING OF SURGERY WITH A SIP OF WATER:  - fluticasone (FLONASE)  - rosuvastatin (CRESTOR)   One week prior to surgery: Stop Anti-inflammatories (NSAIDS) such as Advil, Aleve, Ibuprofen, Motrin, Naproxen, Naprosyn and Aspirin based products such as Excedrin, Goodys Powder, BC Powder.  Stop ANY OVER THE COUNTER supplements until after surgery.ELDERBERRY , Turmeric .folic acid .  You may however, continue to take Tylenol if needed for pain up until the day of surgery.  No Alcohol for 24 hours before or after surgery.  No Smoking including e-cigarettes for 24 hours prior to surgery.  No chewable tobacco products for at least 6 hours prior to surgery.  No nicotine patches on the day of surgery.  Do not use any "recreational" drugs for at least a week prior to your surgery.  Please be advised that the combination of  cocaine and anesthesia may have negative outcomes, up to and including death. If you test positive for cocaine, your surgery will be cancelled.  On the morning of surgery brush your teeth with toothpaste and water, you may rinse your mouth with mouthwash if you wish. Do not swallow any toothpaste or mouthwash.  Do not wear jewelry, make-up, hairpins, clips or nail polish.  Do not wear lotions, powders, or perfumes.   Do not shave body from the neck down 48 hours prior to surgery just in case you cut yourself which could leave a site for infection. Also, freshly shaved skin may become irritated if using the CHG soap.  Contact lenses, hearing aids and dentures may not be worn into surgery.  Do not bring valuables to the hospital. [facility] is not responsible for any missing/lost belongings or valuables.   Notify your doctor if there is any change in your medical condition (cold, fever, infection).  Wear comfortable clothing (specific to your surgery type) to the hospital.  After surgery, you can help prevent lung complications by doing breathing exercises.  Take deep breaths and cough every 1-2 hours. Your doctor may order a device called an Incentive Spirometer to help you take deep breaths. When coughing or sneezing, hold a pillow firmly against your incision with both hands. This is called "splinting." Doing this helps protect your incision. It also decreases belly discomfort.  If you are being admitted to the hospital overnight, leave your suitcase in the  car. After surgery it may be brought to your room.  If you are being discharged the day of surgery, you will not be allowed to drive home. You will need a responsible adult (18 years or older) to drive you home and stay with you that night.   If you are taking public transportation, you will need to have a responsible adult (18 years or older) with you. Please confirm with your physician that it is acceptable to use public  transportation.   Please call the [place] Dept. at [phone] if you have any questions about these instructions.  Surgery Visitation Policy:  Patients undergoing a surgery or procedure may have two family members or support persons with them as long as the person is not COVID-19 positive or experiencing its symptoms.   Inpatient Visitation:    Visiting hours are 7 a.m. to 8 p.m. Up to four visitors are allowed at one time in a patient room. The visitors may rotate out with other people during the day. One designated support person (adult) may remain overnight.  Due to an increase in RSV and influenza rates and associated hospitalizations, children ages 26 and under will not be able to visit patients in [facility]. Masks continue to be strongly recommended.

## 2022-03-31 ENCOUNTER — Encounter
Admission: RE | Admit: 2022-03-31 | Discharge: 2022-03-31 | Disposition: A | Discharge status: Home / Self Care | Payer: Medicare Other | Source: Ambulatory Visit | Attending: Otolaryngology | Admitting: Otolaryngology

## 2022-03-31 DIAGNOSIS — Z01812 Encounter for preprocedural laboratory examination: Secondary | ICD-10-CM

## 2022-03-31 DIAGNOSIS — Z01810 Encounter for preprocedural cardiovascular examination: Secondary | ICD-10-CM | POA: Diagnosis present

## 2022-04-05 ENCOUNTER — Encounter: Payer: Self-pay | Admitting: Otolaryngology

## 2022-04-05 ENCOUNTER — Observation Stay
Admission: RE | Admit: 2022-04-05 | Discharge: 2022-04-06 | Disposition: A | Discharge status: Home / Self Care | Payer: Medicare Other | Attending: Otolaryngology | Admitting: Otolaryngology

## 2022-04-05 ENCOUNTER — Ambulatory Visit: Payer: Medicare Other

## 2022-04-05 ENCOUNTER — Encounter
Admission: RE | Disposition: A | Discharge status: Home / Self Care | Payer: Self-pay | Source: Home / Self Care | Attending: Otolaryngology

## 2022-04-05 ENCOUNTER — Other Ambulatory Visit: Payer: Self-pay

## 2022-04-05 DIAGNOSIS — Z9889 Other specified postprocedural states: Secondary | ICD-10-CM

## 2022-04-05 DIAGNOSIS — Z7982 Long term (current) use of aspirin: Secondary | ICD-10-CM | POA: Diagnosis not present

## 2022-04-05 DIAGNOSIS — E041 Nontoxic single thyroid nodule: Secondary | ICD-10-CM | POA: Insufficient documentation

## 2022-04-05 DIAGNOSIS — Z853 Personal history of malignant neoplasm of breast: Secondary | ICD-10-CM | POA: Diagnosis not present

## 2022-04-05 DIAGNOSIS — D440 Neoplasm of uncertain behavior of thyroid gland: Secondary | ICD-10-CM | POA: Diagnosis present

## 2022-04-05 DIAGNOSIS — C73 Malignant neoplasm of thyroid gland: Principal | ICD-10-CM | POA: Insufficient documentation

## 2022-04-05 DIAGNOSIS — Z87891 Personal history of nicotine dependence: Secondary | ICD-10-CM | POA: Insufficient documentation

## 2022-04-05 DIAGNOSIS — Z79899 Other long term (current) drug therapy: Secondary | ICD-10-CM | POA: Diagnosis not present

## 2022-04-05 DIAGNOSIS — E890 Postprocedural hypothyroidism: Secondary | ICD-10-CM

## 2022-04-05 HISTORY — PX: CONTINUOUS NERVE MONITORING: SHX6650

## 2022-04-05 HISTORY — PX: THYROIDECTOMY: SHX17

## 2022-04-05 LAB — CALCIUM
Calcium: 8.5 mg/dL — ABNORMAL LOW (ref 8.9–10.3)
Calcium: 8.9 mg/dL (ref 8.9–10.3)

## 2022-04-05 LAB — ALBUMIN: Albumin: 3.5 g/dL (ref 3.5–5.0)

## 2022-04-05 LAB — MAGNESIUM: Magnesium: 1.9 mg/dL (ref 1.7–2.4)

## 2022-04-05 SURGERY — THYROIDECTOMY
Anesthesia: General | Laterality: Bilateral

## 2022-04-05 MED ORDER — PHENYLEPHRINE HCL-NACL 20-0.9 MG/250ML-% IV SOLN
INTRAVENOUS | Status: AC
  Filled 2022-04-05: qty 250

## 2022-04-05 MED ORDER — DROPERIDOL 2.5 MG/ML IJ SOLN: 0.6250 mg | Freq: Once | INTRAMUSCULAR | Status: DC | PRN

## 2022-04-05 MED ORDER — ACETAMINOPHEN 500 MG PO TABS
ORAL_TABLET | ORAL | Status: AC
  Filled 2022-04-05: qty 2

## 2022-04-05 MED ORDER — PROPOFOL 10 MG/ML IV BOLUS
INTRAVENOUS | Status: DC | PRN
  Administered 2022-04-05: 150 mg via INTRAVENOUS
  Administered 2022-04-05: 50 mg via INTRAVENOUS

## 2022-04-05 MED ORDER — PROPOFOL 1000 MG/100ML IV EMUL
INTRAVENOUS | Status: AC
  Filled 2022-04-05: qty 100

## 2022-04-05 MED ORDER — ONDANSETRON HCL 4 MG/2ML IJ SOLN: 4.0000 mg | INTRAMUSCULAR | Status: DC | PRN

## 2022-04-05 MED ORDER — ONDANSETRON HCL 4 MG/2ML IJ SOLN
INTRAMUSCULAR | Status: AC
  Filled 2022-04-05: qty 2

## 2022-04-05 MED ORDER — ACETAMINOPHEN 10 MG/ML IV SOLN
INTRAVENOUS | Status: DC | PRN
  Administered 2022-04-05: 1000 mg via INTRAVENOUS

## 2022-04-05 MED ORDER — ORAL CARE MOUTH RINSE: 15.0000 mL | Freq: Once | OROMUCOSAL | Status: AC

## 2022-04-05 MED ORDER — DEXMEDETOMIDINE HCL IN NACL 80 MCG/20ML IV SOLN
INTRAVENOUS | Status: DC | PRN
  Administered 2022-04-05: 4 ug via BUCCAL
  Administered 2022-04-05: 8 ug via BUCCAL

## 2022-04-05 MED ORDER — FENTANYL CITRATE (PF) 100 MCG/2ML IJ SOLN
INTRAMUSCULAR | Status: AC
  Administered 2022-04-05: 25 ug via INTRAVENOUS
  Filled 2022-04-05: qty 2

## 2022-04-05 MED ORDER — LACTATED RINGERS IV SOLN: INTRAVENOUS | Status: DC

## 2022-04-05 MED ORDER — OXYCODONE HCL 5 MG PO TABS: 5.0000 mg | ORAL_TABLET | Freq: Once | ORAL | Status: DC | PRN

## 2022-04-05 MED ORDER — CHLORHEXIDINE GLUCONATE 0.12 % MT SOLN: 15.0000 mL | Freq: Once | OROMUCOSAL | Status: AC

## 2022-04-05 MED ORDER — DEXMEDETOMIDINE HCL IN NACL 80 MCG/20ML IV SOLN
INTRAVENOUS | Status: AC
  Filled 2022-04-05: qty 20

## 2022-04-05 MED ORDER — MIDAZOLAM HCL 5 MG/5ML IJ SOLN
INTRAMUSCULAR | Status: DC | PRN
  Administered 2022-04-05: 2 mg via INTRAVENOUS

## 2022-04-05 MED ORDER — CALCIUM CARBONATE 1250 (500 CA) MG PO TABS
1000.0000 mg | ORAL_TABLET | Freq: Three times a day (TID) | ORAL | Status: DC
  Administered 2022-04-05 – 2022-04-06 (×2): 2500 mg via ORAL
  Filled 2022-04-05 (×4): qty 2

## 2022-04-05 MED ORDER — KETAMINE HCL 50 MG/5ML IJ SOSY
PREFILLED_SYRINGE | INTRAMUSCULAR | Status: AC
  Filled 2022-04-05: qty 5

## 2022-04-05 MED ORDER — EPINEPHRINE PF 1 MG/ML IJ SOLN
INTRAMUSCULAR | Status: AC
  Filled 2022-04-05: qty 1

## 2022-04-05 MED ORDER — DEXAMETHASONE SODIUM PHOSPHATE 10 MG/ML IJ SOLN
INTRAMUSCULAR | Status: AC
  Filled 2022-04-05: qty 1

## 2022-04-05 MED ORDER — LIDOCAINE HCL (PF) 2 % IJ SOLN
INTRAMUSCULAR | Status: AC
  Filled 2022-04-05: qty 5

## 2022-04-05 MED ORDER — BACITRACIN ZINC 500 UNIT/GM EX OINT
TOPICAL_OINTMENT | CUTANEOUS | Status: AC
  Filled 2022-04-05: qty 28.35

## 2022-04-05 MED ORDER — SODIUM CHLORIDE (PF) 0.9 % IJ SOLN
INTRAMUSCULAR | Status: AC
  Filled 2022-04-05: qty 40

## 2022-04-05 MED ORDER — REMIFENTANIL HCL 1 MG IV SOLR
INTRAVENOUS | Status: AC
  Filled 2022-04-05: qty 1000

## 2022-04-05 MED ORDER — BUPIVACAINE-EPINEPHRINE (PF) 0.5% -1:200000 IJ SOLN
INTRAMUSCULAR | Status: DC | PRN
  Administered 2022-04-05: 5 mL via PERINEURAL

## 2022-04-05 MED ORDER — FENTANYL CITRATE (PF) 100 MCG/2ML IJ SOLN
INTRAMUSCULAR | Status: DC | PRN
  Administered 2022-04-05 (×2): 50 ug via INTRAVENOUS

## 2022-04-05 MED ORDER — LIDOCAINE HCL (CARDIAC) PF 100 MG/5ML IV SOSY
PREFILLED_SYRINGE | INTRAVENOUS | Status: DC | PRN
  Administered 2022-04-05: 100 mg via INTRAVENOUS

## 2022-04-05 MED ORDER — FENTANYL CITRATE (PF) 100 MCG/2ML IJ SOLN
25.0000 ug | INTRAMUSCULAR | Status: DC | PRN
  Administered 2022-04-05 (×3): 25 ug via INTRAVENOUS

## 2022-04-05 MED ORDER — ACETAMINOPHEN 10 MG/ML IV SOLN: 1000.0000 mg | Freq: Once | INTRAVENOUS | Status: DC | PRN

## 2022-04-05 MED ORDER — HYDRALAZINE HCL 20 MG/ML IJ SOLN
INTRAMUSCULAR | Status: AC
  Filled 2022-04-05: qty 1

## 2022-04-05 MED ORDER — ONDANSETRON HCL 4 MG/2ML IJ SOLN
INTRAMUSCULAR | Status: DC | PRN
  Administered 2022-04-05: 4 mg via INTRAVENOUS

## 2022-04-05 MED ORDER — FENTANYL CITRATE (PF) 100 MCG/2ML IJ SOLN
INTRAMUSCULAR | Status: AC
  Filled 2022-04-05: qty 2

## 2022-04-05 MED ORDER — FOLIC ACID 1 MG PO TABS
1.0000 mg | ORAL_TABLET | Freq: Every day | ORAL | Status: DC
  Administered 2022-04-06: 1 mg via ORAL
  Filled 2022-04-05: qty 1

## 2022-04-05 MED ORDER — PHENYLEPHRINE HCL-NACL 20-0.9 MG/250ML-% IV SOLN
INTRAVENOUS | Status: DC | PRN
  Administered 2022-04-05: 20 ug/min via INTRAVENOUS

## 2022-04-05 MED ORDER — BUPIVACAINE HCL (PF) 0.25 % IJ SOLN
INTRAMUSCULAR | Status: AC
  Filled 2022-04-05: qty 30

## 2022-04-05 MED ORDER — HYDROCODONE-ACETAMINOPHEN 7.5-325 MG/15ML PO SOLN
ORAL | Status: AC
  Filled 2022-04-05: qty 15

## 2022-04-05 MED ORDER — ACETAMINOPHEN 500 MG PO TABS
1000.0000 mg | ORAL_TABLET | Freq: Four times a day (QID) | ORAL | Status: DC | PRN
  Administered 2022-04-05: 1000 mg via ORAL

## 2022-04-05 MED ORDER — DEXTROSE-NACL 5-0.45 % IV SOLN: INTRAVENOUS | Status: DC

## 2022-04-05 MED ORDER — DEXAMETHASONE SODIUM PHOSPHATE 10 MG/ML IJ SOLN
INTRAMUSCULAR | Status: DC | PRN
  Administered 2022-04-05: 10 mg via INTRAVENOUS

## 2022-04-05 MED ORDER — ONDANSETRON HCL 4 MG/2ML IJ SOLN
INTRAMUSCULAR | Status: AC
  Administered 2022-04-05: 4 mg via INTRAVENOUS
  Filled 2022-04-05: qty 2

## 2022-04-05 MED ORDER — DOCUSATE SODIUM 100 MG PO CAPS
100.0000 mg | ORAL_CAPSULE | Freq: Two times a day (BID) | ORAL | Status: DC
  Administered 2022-04-05 – 2022-04-06 (×2): 100 mg via ORAL

## 2022-04-05 MED ORDER — FAMOTIDINE 20 MG PO TABS
ORAL_TABLET | ORAL | Status: AC
  Administered 2022-04-05: 20 mg via ORAL
  Filled 2022-04-05: qty 1

## 2022-04-05 MED ORDER — HYDRALAZINE HCL 20 MG/ML IJ SOLN
10.0000 mg | Freq: Once | INTRAMUSCULAR | Status: AC
  Administered 2022-04-05: 10 mg via INTRAVENOUS

## 2022-04-05 MED ORDER — ONDANSETRON HCL 4 MG PO TABS: 4.0000 mg | ORAL_TABLET | ORAL | Status: DC | PRN

## 2022-04-05 MED ORDER — BUPIVACAINE-EPINEPHRINE (PF) 0.5% -1:200000 IJ SOLN
INTRAMUSCULAR | Status: AC
  Filled 2022-04-05: qty 30

## 2022-04-05 MED ORDER — LACTATED RINGERS IV SOLN: INTRAVENOUS | Status: DC | PRN

## 2022-04-05 MED ORDER — HYDROCODONE-ACETAMINOPHEN 7.5-325 MG/15ML PO SOLN
10.0000 mL | ORAL | Status: DC | PRN
  Administered 2022-04-05 (×2): 15 mL via ORAL

## 2022-04-05 MED ORDER — ROSUVASTATIN CALCIUM 5 MG PO TABS
5.0000 mg | ORAL_TABLET | Freq: Every day | ORAL | Status: DC
  Administered 2022-04-06: 5 mg via ORAL
  Filled 2022-04-05: qty 1

## 2022-04-05 MED ORDER — PROMETHAZINE HCL 25 MG/ML IJ SOLN: 6.2500 mg | INTRAMUSCULAR | Status: DC | PRN

## 2022-04-05 MED ORDER — FAMOTIDINE 20 MG PO TABS: 20.0000 mg | ORAL_TABLET | Freq: Once | ORAL | Status: AC

## 2022-04-05 MED ORDER — PROPOFOL 10 MG/ML IV BOLUS
INTRAVENOUS | Status: AC
  Filled 2022-04-05: qty 20

## 2022-04-05 MED ORDER — MORPHINE SULFATE (PF) 2 MG/ML IV SOLN: 2.0000 mg | INTRAVENOUS | Status: DC | PRN

## 2022-04-05 MED ORDER — OXYCODONE HCL 5 MG/5ML PO SOLN: 5.0000 mg | Freq: Once | ORAL | Status: DC | PRN

## 2022-04-05 MED ORDER — REMIFENTANIL HCL 1 MG IV SOLR
INTRAVENOUS | Status: DC | PRN
  Administered 2022-04-05: .15 ug/kg/min via INTRAVENOUS

## 2022-04-05 MED ORDER — CHLORHEXIDINE GLUCONATE 0.12 % MT SOLN
OROMUCOSAL | Status: AC
  Administered 2022-04-05: 15 mL via OROMUCOSAL
  Filled 2022-04-05: qty 15

## 2022-04-05 MED ORDER — SUCCINYLCHOLINE CHLORIDE 200 MG/10ML IV SOSY
PREFILLED_SYRINGE | INTRAVENOUS | Status: AC
  Filled 2022-04-05: qty 10

## 2022-04-05 MED ORDER — PROPOFOL 500 MG/50ML IV EMUL
INTRAVENOUS | Status: DC | PRN
  Administered 2022-04-05: 100 ug/kg/min via INTRAVENOUS

## 2022-04-05 MED ORDER — MIDAZOLAM HCL 2 MG/2ML IJ SOLN
INTRAMUSCULAR | Status: AC
  Filled 2022-04-05: qty 2

## 2022-04-05 MED ORDER — AZELASTINE HCL 0.1 % NA SOLN: 2.0000 | Freq: Every evening | NASAL | Status: DC | PRN

## 2022-04-05 MED ORDER — DOCUSATE SODIUM 100 MG PO CAPS
ORAL_CAPSULE | ORAL | Status: AC
  Filled 2022-04-05: qty 1

## 2022-04-05 MED ORDER — ACETAMINOPHEN 10 MG/ML IV SOLN
INTRAVENOUS | Status: AC
  Filled 2022-04-05: qty 100

## 2022-04-05 MED ORDER — SUCCINYLCHOLINE CHLORIDE 200 MG/10ML IV SOSY
PREFILLED_SYRINGE | INTRAVENOUS | Status: DC | PRN
  Administered 2022-04-05: 100 mg via INTRAVENOUS

## 2022-04-05 SURGICAL SUPPLY — 43 items
BLADE SURG 15 STRL LF DISP TIS (BLADE) ×1 IMPLANT
BLADE SURG 15 STRL SS (BLADE) ×1
BULB RESERV EVAC DRAIN JP 100C (MISCELLANEOUS) IMPLANT
CORD BIP STRL DISP 12FT (MISCELLANEOUS) ×1 IMPLANT
DERMABOND ADVANCED .7 DNX12 (GAUZE/BANDAGES/DRESSINGS) IMPLANT
DRAIN JP 10F RND SILICONE (MISCELLANEOUS) IMPLANT
DRAPE MAG INST 16X20 L/F (DRAPES) ×1 IMPLANT
DRSG TEGADERM 2-3/8X2-3/4 SM (GAUZE/BANDAGES/DRESSINGS) IMPLANT
ELECT CAUTERY BLADE TIP 2.5 (TIP) ×1
ELECT LARYNGEAL DUAL CHAN (ELECTRODE) ×1 IMPLANT
ELECT NEEDLE 20X.3 GREEN (MISCELLANEOUS) ×1
ELECT REM PT RETURN 9FT ADLT (ELECTROSURGICAL) ×1
ELECTRODE CAUTERY BLDE TIP 2.5 (TIP) ×1 IMPLANT
ELECTRODE NDL 20X.3 GREEN (MISCELLANEOUS) ×1 IMPLANT
ELECTRODE NEEDLE 20X.3 GREEN (MISCELLANEOUS) ×1 IMPLANT
ELECTRODE REM PT RTRN 9FT ADLT (ELECTROSURGICAL) ×1 IMPLANT
FORCEPS JEWEL BIP 4-3/4 STR (INSTRUMENTS) ×1 IMPLANT
GAUZE 4X4 16PLY ~~LOC~~+RFID DBL (SPONGE) ×1 IMPLANT
GLOVE BIO SURGEON STRL SZ7.5 (GLOVE) ×2 IMPLANT
GOWN STRL REUS W/ TWL LRG LVL3 (GOWN DISPOSABLE) ×3 IMPLANT
GOWN STRL REUS W/TWL LRG LVL3 (GOWN DISPOSABLE) ×3
HEMOSTAT SURGICEL 2X3 (HEMOSTASIS) ×1 IMPLANT
HOOK STAY BLUNT/RETRACTOR 5M (MISCELLANEOUS) IMPLANT
KIT TURNOVER KIT A (KITS) ×1 IMPLANT
LABEL OR SOLS (LABEL) ×1 IMPLANT
MANIFOLD NEPTUNE II (INSTRUMENTS) ×1 IMPLANT
NS IRRIG 500ML POUR BTL (IV SOLUTION) ×1 IMPLANT
PACK HEAD/NECK (MISCELLANEOUS) ×1 IMPLANT
PROBE NEUROSIGN BIPOL (MISCELLANEOUS) ×1 IMPLANT
PROBE NEUROSIGN BIPOLAR (MISCELLANEOUS) ×1
SHEARS HARMONIC 9CM CVD (BLADE) ×1 IMPLANT
SOL PREP PVP 2OZ (MISCELLANEOUS) ×1
SOLUTION PREP PVP 2OZ (MISCELLANEOUS) ×1 IMPLANT
SPONGE KITTNER 5P (MISCELLANEOUS) ×1 IMPLANT
STRIP CLOSURE SKIN 1/4X4 (GAUZE/BANDAGES/DRESSINGS) IMPLANT
SUT PROLENE 6 0 P 1 18 (SUTURE) ×1 IMPLANT
SUT SILK 2 0 (SUTURE) ×1
SUT SILK 2 0 SH (SUTURE) ×1 IMPLANT
SUT SILK 2-0 18XBRD TIE 12 (SUTURE) ×1 IMPLANT
SUT VIC AB 4-0 RB1 18 (SUTURE) ×1 IMPLANT
SYSTEM CHEST DRAIN TLS 7FR (DRAIN) IMPLANT
TRAP FLUID SMOKE EVACUATOR (MISCELLANEOUS) ×1 IMPLANT
WATER STERILE IRR 500ML POUR (IV SOLUTION) ×1 IMPLANT

## 2022-04-05 NOTE — Discharge Instructions (Signed)

## 2022-04-05 NOTE — Op Note (Signed)
....04/05/2022  10:02 AM    Adelina Mings  [id number]   Pre-Op Dx: Thyroid Nodule  Post-op Dx: SAME  Proc: Total Thyroidectomy with Laryngeal Nerve Monitoring  Surg:  Carloyn Manner  Assistant:  McQueen  Anes:  GOT  EBL:  <10ccs  Comp:  None   Indications: Left sided thyroid nodule indeterminate with Bethesda Class IV nodule  Findings: Bilateral recurrent laryngeal nerves identified and preserved and robustly stimulated at end of procedure, left superior and inferior parathyroid glands identified and preserved.  Right superior and inferior parathyroid glands identified and preserved.    Description of Procedure:  After the patient was identified in hold and the history and physical and consent was reviewed and updated.  The patient was marked in an upright position on the anterior neck along a natural occuring skin crease.  The patient was next taken to the operating room and placed in a supine position.  General endotracheal anesthesia was induced with laryngeal monitor endotracheal tube.  Direct visualization by the surgeon of the tube electrodes in contact with the vocal cords was made..  The patient's anterior marked neck crease was neck injected with 5cc's of 0.5% marcaine with 1:200,000 Epinephrine.  The patient was next prepped and draped in a sterile normal fashion.  At this time, a 15 blade scalpel was used to make a skin incision along a previously marked anterior neck crease.  Dissection was carefully performed through the subcutaneous tissues with combination of Bovie electrocautery and blunt dissection.  The platysma was incised and anterior neck veins ligated with harmonic scalpel.  The median raphe of the strap muscles was divided in a linear fashion with Bovie electrocautery until the anterior border of the thyroid gland was identified.     Attention at this time was directed to the patient's left side. The sternohyoid muscle was bluntly dissected away from the  large left thyroid gland.  The lateral border of the thyroid and the carotid artery was identified.  Dissection bluntly and with Bovie electrocautery was continued superiorly and inferiorly along the lateral edge of the thyroid.  The superior vessels were identified laterally and then medially with blunt dissection in Joel's space between the larynx and the superior thyroid pole.  Further dissection was continued inferiorly as well.  Once the superior pole was pedicled, the superior thyroid vessels were ligated with Harmonic Scalpel.  The large left thyroid was delivered from the wound.  The large left thyroid nodule once delivered from the wound revealed Berry's ligament and the nodule of Zuckerkandl.  Just beneath this nodule, the recurrent laryngeal nerve was identified and stimulated robustly with movement of the arytenoid joint.  The inferior parathyroid was identified adjacent to the nerve along with the superior parathyroid as well.  These were identified and protected.  The nerve was tracked until its insertion into the larynx.  Next, the remaining attachments of Berry's ligament was divided and the left thyroid gland was completed.  Given the large size of the gland, the left hemithyroid was removed with division at the isthmus.  A small capsule break occurred at this time on the left lateral surface.  This was marked on the superior pole with a silk suture.  Attention at this time was directed to the patient's right side. The sternohyoid muscle was bluntly dissected away from the right hemithyroid.  The lateral border of the thyroid and the carotid artery was identified.  Dissection bluntly and with Bovie electrocautery was continued superiorly and inferiorly along the  lateral edge of the thyroid.  The superior vessels were identified laterally and then medially with blunt dissection in Joel's space between the larynx and the superior thyroid pole.  Further dissection was continued inferiorly as well.   Once the superior pole was pedicled, the superior thyroid vessels were ligated with Harmonic Scalpel.  The right hemithyroid was delivered from the wound after further dissection inferior and superiorly.   Once delivered from the wound revealed Berry's ligament and the nodule of Zuckerkandl.  Just beneath this nodule, the recurrent laryngeal nerve was identified and stimulated robustly with movement of the arytenoid joint.  The inferior parathyroid was identified adjacent to the nerve along with the superior parathyroid as well.  These were identified with good vascularization.  The nerve was tracked until its insertion into the larynx.  Next, the remaining attachments of Berry's ligament was divided and the right hemithyroid gland was completed.  There remaining attachments of the right hemi-thyroid were separated from the larynx using bipolar and harmonic scalpel.  The gland was marked on the superior pole of the patient's right thyroid gland with a marking stitch with vicryl and passed off the table for permanent evaluation.  The wound was copiously irrigated with sterile saline.  Meticulous hemostasis with bipolar was obtained.  Visualization of an intact left and right recurrent laryngeal nerves was made and these stimulated robustly.  The parathyroid glands were intact and well perfused except for the left inferior parathyroid gland which was reimplanted into the left strap muscles and marked with a 4.0 prolene suture.  Surgicel was placed in the wound bed bilaterally.  The wound was then closed in a multilayered fashion with vicryl for subcutaneous tissues.  At this time, slight oozing was noted from the wound.  This was reopened and evaluated and hemostasis was continued.  The wound was closed again in the multilayered fashion with vicryl for strap muscles and subcutaneous and platysma.  The skin was closed with dermabond and topped with a steri-strip.  At this time the patient was extubated and taken to  PACU in good condition.  Plan:  Admit for observation.  Follow pathology.  Limit activity for 2 weeks.  Follow up next week for post-operative evaluation and suture removal.  Follow calcium levels.  Calcium taper.  Miriana Gaertner  04/05/2022 10:02 AM

## 2022-04-05 NOTE — Transfer of Care (Addendum)
Immediate Anesthesia Transfer of Care Note  Patient: Ariana Lopez  Procedure(s) Performed: TOTAL THYROIDECTOMY (Bilateral) CONTINUOUS NERVE MONITORING (Bilateral)  Patient Location: PACU  Anesthesia Type:General  Level of Consciousness: awake and drowsy  Airway & Oxygen Therapy: Patient Spontanous Breathing and Patient connected to face mask oxygen  Post-op Assessment: Report given to RN and Post -op Vital signs reviewed and stable  Post vital signs: Reviewed  Last Vitals:  Vitals Value Taken Time  BP 163/73 04/05/22 1015  Temp    Pulse 85 04/05/22 1018  Resp 18 04/05/22 1018  SpO2 99 % 04/05/22 1018  Vitals shown include unvalidated device data.  Last Pain:  Vitals:   04/05/22 0629  TempSrc: Temporal  PainSc: 5          Complications: No notable events documented.

## 2022-04-05 NOTE — Anesthesia Postprocedure Evaluation (Signed)
Anesthesia Post Note  Patient: Ariana Lopez  Procedure(s) Performed: TOTAL THYROIDECTOMY (Bilateral) CONTINUOUS NERVE MONITORING (Bilateral)  Patient location during evaluation: PACU Anesthesia Type: General Level of consciousness: awake and alert Pain management: pain level controlled Vital Signs Assessment: post-procedure vital signs reviewed and stable Respiratory status: spontaneous breathing, nonlabored ventilation, respiratory function stable and patient connected to nasal cannula oxygen Cardiovascular status: blood pressure returned to baseline and stable Postop Assessment: no apparent nausea or vomiting Anesthetic complications: no   No notable events documented.   Last Vitals:  Vitals:   04/05/22 1145 04/05/22 1157  BP: (!) 167/68 (!) 167/68  Pulse: (!) 101 (!) 101  Resp: 15 15  Temp:  37.1 C  SpO2: 92%     Last Pain:  Vitals:   04/05/22 1157  TempSrc: Temporal  PainSc:                  Precious Haws Liya Strollo

## 2022-04-05 NOTE — Anesthesia Preprocedure Evaluation (Addendum)
Anesthesia Evaluation  Patient identified by MRN, date of birth, ID band Patient awake    Reviewed: Allergy & Precautions, NPO status , Patient's Chart, lab work & pertinent test results  History of Anesthesia Complications (+) PONV and history of anesthetic complications  Airway Mallampati: III  TM Distance: >3 FB Neck ROM: full    Dental no notable dental hx.    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  No date: Complication of anesthesia     Comment:  states that she has had past issues with HA No date: Headache No date: History of thyroid nodule No date: History of tinnitus 07/21/2019: Low vitamin D level 11/13/2012: Obesity (BMI 30.0-34.9) No date: Personal history of chemotherapy No date: Personal history of radiation therapy No date: Pneumonia No date: PONV (postoperative nausea and vomiting) No date: Sleep apnea 07/21/2019: Thyroid nodule  Past Surgical History: No date: BREAST BIOPSY No date: BREAST LUMPECTOMY; Left No date: BREAST SURGERY 2000: CESAREAN SECTION 06/10/2016: COLONOSCOPY WITH PROPOFOL; N/A     Comment:  Procedure: COLONOSCOPY WITH  PROPOFOL;  Surgeon: Jonathon Bellows, MD;  Location: [facility] ENDOSCOPY;  Service: Endoscopy;              Laterality: N/A; No date: DILATION AND CURETTAGE OF UTERUS 2002: left breast lumpectomy; Left No date: MYOMECTOMY No date: uterine lift     Comment:  ballooning  BMI    Body Mass Index: 34.06 kg/m      Reproductive/Obstetrics negative OB ROS                             Anesthesia Physical Anesthesia Plan  ASA: 2  Anesthesia Plan: General ETT   Post-op Pain Management: Regional block* and Ofirmev IV (intra-op)*   Induction: Intravenous  PONV Risk Score and Plan: 4 or greater and Ondansetron, Dexamethasone, Midazolam, Propofol infusion and Droperidol  Airway Management Planned: Oral ETT  Additional Equipment:   Intra-op Plan:   Post-operative Plan: Extubation in OR  Informed Consent: I have reviewed the patients History and Physical, chart, labs and discussed the procedure including the risks, benefits and alternatives for the proposed anesthesia with the patient or authorized representative who has indicated his/her understanding and acceptance.     Dental Advisory Given  Plan Discussed with: Anesthesiologist, CRNA and Surgeon  Anesthesia Plan Comments:         Anesthesia Quick Evaluation

## 2022-04-05 NOTE — Anesthesia Procedure Notes (Addendum)
Procedure Name: Intubation Date/Time: 04/05/2022 7:44 AM  Performed by: Biagio Borg, CRNAPre-anesthesia Checklist: Patient identified, Emergency Drugs available, Suction available and Patient being monitored Patient Re-evaluated:Patient Re-evaluated prior to induction Oxygen Delivery Method: Circle system utilized Preoxygenation: Pre-oxygenation with 100% oxygen Induction Type: IV induction Ventilation: Mask ventilation without difficulty Laryngoscope Size: McGraph and 3 Grade View: Grade I Tube type: Oral Tube size: 7.0 mm Number of attempts: 1 Airway Equipment and Method: Stylet and Video-laryngoscopy Placement Confirmation: ETT inserted through vocal cords under direct vision, positive ETCO2 and breath sounds checked- equal and bilateral Secured at: 24 cm Tube secured with: Tape Dental Injury: Teeth and Oropharynx as per pre-operative assessment  Comments: ETT placement verified by surgeon

## 2022-04-05 NOTE — H&P (Signed)
..  History and Physical paper copy reviewed and updated date of procedure and will be scanned into system.  Patient seen and examined and marked for total thyroidectomy

## 2022-04-06 ENCOUNTER — Encounter: Payer: Self-pay | Admitting: Otolaryngology

## 2022-04-06 DIAGNOSIS — C73 Malignant neoplasm of thyroid gland: Secondary | ICD-10-CM | POA: Diagnosis not present

## 2022-04-06 LAB — CALCIUM: Calcium: 8.7 mg/dL — ABNORMAL LOW (ref 8.9–10.3)

## 2022-04-06 MED ORDER — CALCIUM CARBONATE 1250 (500 CA) MG PO TABS: 2.0000 | ORAL_TABLET | Freq: Three times a day (TID) | ORAL | 6 refills | Status: DC

## 2022-04-06 MED ORDER — ONDANSETRON HCL 4 MG PO TABS: 4.0000 mg | ORAL_TABLET | ORAL | 0 refills | Status: DC | PRN

## 2022-04-06 MED ORDER — DOCUSATE SODIUM 100 MG PO CAPS
ORAL_CAPSULE | ORAL | Status: AC
  Filled 2022-04-06: qty 1

## 2022-04-06 MED ORDER — HYDROCODONE-ACETAMINOPHEN 7.5-325 MG/15ML PO SOLN: 10.0000 mL | ORAL | 0 refills | Status: DC | PRN

## 2022-04-06 MED ORDER — DOCUSATE SODIUM 100 MG PO CAPS: 100.0000 mg | ORAL_CAPSULE | Freq: Two times a day (BID) | ORAL | 0 refills | Status: DC

## 2022-04-06 NOTE — Final Progress Note (Signed)
..  04/06/2022 8:48 AM  Ariana Lopez, Ariana Lopez [id number]  Post-Op Day 1    Temp:  [97.2 F (36.2 C)-98.7 F (37.1 C)] 98 F (36.7 C) (01/10 0710) Pulse Rate:  [65-103] 65 (01/10 0710) Resp:  [14-25] 16 (01/10 0710) BP: (100-179)/(52-104) 125/59 (01/10 0710) SpO2:  [89 %-99 %] 95 % (01/10 0710) Weight:  [101.6 kg] 101.6 kg (01/09 1605),     Intake/Output Summary (Last 24 hours) at 04/06/2022 0848 Last data filed at 04/05/2022 1145 Gross per 24 hour  Intake 750 ml  Output 250 ml  Net 500 ml    Results for orders placed or performed during the hospital encounter of 04/05/22 (from the past 24 hour(s))  Calcium     Status: Abnormal   Collection Time: 04/05/22 10:36 AM  Result Value Ref Range   Calcium 8.5 (L) 8.9 - 10.3 mg/dL  Magnesium     Status: None   Collection Time: 04/05/22 10:36 AM  Result Value Ref Range   Magnesium 1.9 1.7 - 2.4 mg/dL  Albumin     Status: None   Collection Time: 04/05/22 10:36 AM  Result Value Ref Range   Albumin 3.5 3.5 - 5.0 g/dL  Calcium     Status: None   Collection Time: 04/05/22  3:23 PM  Result Value Ref Range   Calcium 8.9 8.9 - 10.3 mg/dL  Calcium     Status: Abnormal   Collection Time: 04/06/22  6:29 AM  Result Value Ref Range   Calcium 8.7 (L) 8.9 - 10.3 mg/dL    SUBJECTIVE:  No acute events overnight.  Patient reports pain controlled.  No breathing difficulty.  OBJECTIVE:  GEN-  NAD, supine in bed NECK-  incision c/d/I.  Mild ecchymosis surrounding incision and mild fullness mostly on left side.  IMPRESSION:  s/p total thyroidectomy pod#1 with calcium 8.7 which is higher than immediately post-operative and not significantly changed from last nights.    PLAN:  OK for discharge given pain control as well as stable calcium.  Discharge home on calcium taper '1000mg'$  elemental BID x 7 days.  Follow up in 1 week.  Continue ice to neck for next 48 hours.  Pain control.  [name] 04/06/2022, 8:48 AM

## 2022-04-18 LAB — SURGICAL PATHOLOGY

## 2022-05-06 ENCOUNTER — Other Ambulatory Visit: Payer: Self-pay | Admitting: Internal Medicine

## 2022-05-06 DIAGNOSIS — C73 Malignant neoplasm of thyroid gland: Secondary | ICD-10-CM

## 2022-05-17 ENCOUNTER — Ambulatory Visit
Admission: RE | Admit: 2022-05-17 | Discharge: 2022-05-17 | Disposition: A | Discharge status: Home / Self Care | Payer: Medicare Other | Source: Ambulatory Visit | Attending: Internal Medicine | Admitting: Internal Medicine

## 2022-05-17 DIAGNOSIS — C73 Malignant neoplasm of thyroid gland: Secondary | ICD-10-CM

## 2022-05-31 ENCOUNTER — Other Ambulatory Visit: Payer: Self-pay | Admitting: Internal Medicine

## 2022-05-31 DIAGNOSIS — C73 Malignant neoplasm of thyroid gland: Secondary | ICD-10-CM

## 2022-05-31 NOTE — Written Directive (Cosign Needed)
MOLECULAR IMAGING AND THERAPEUTICS WRITTEN DIRECTIVE   PATIENT NAME: Ariana Lopez  PT DOB:   Nov 10, 1955                                              MRN: [id number]  ---------------------------------------------------------------------------------------------------------------------   I-131 THYROID CANCER THERAPY   RADIOPHARMACEUTICAL:  Iodine-131 Capsule    PRESCRIBED DOSE FOR ADMINISTRATION: 110  mCi    ROUTE OFADMINISTRATION:  PO   DIAGNOSIS: C73: Hurthle cell carcinoma of thyroid   REFERRING PHYSICIAN: Delrae Rend, MD,  [address], [place]   [phone]   THYROGEN STIMULATION OR HORMONE WITHDRAW: w/ Thyrogen    REMNANT ABLATION OR ADJUVANT THERAPY:    DATE OF THYROIDECTOMY: 04/05/2022    SURGEON: Carloyn Manner, MD, assisted by Beverly Gust, MD   TSH:   Lab Results  Component Value Date   TSH 0.96 06/13/2019   TSH 0.48 05/19/2016   TSH 0.47 10/02/2012  On notes faxed (in [facility])  TSH : _0.02_       05/06/22   PRIOR I-131 THERAPY (Date and Dose):   Pathology:  Cell type: '[]'$   Papillary  '[x]'$   Follicular  '[x]'$   Hurthle   Largest tumor focus:  4    cm  Extrathyroidal Extension?     Yes '[]'$   No '[x]'$     Lymphovascular Invasion?  Yes  '[]'$   No  '[x]'$    (Angioinvasive)  Margins positive ? Yes '[x]'$   No '[]'$     Lymph nodes positive? Yes '[]'$   No  '[]'$    0/0 DIAGNOSIS: A.  LEFT THYROID; HEMITHYROIDECTOMY: - ONCOCYTIC (HURTHLE CELL) CARCINOMA, ENCAPSULATED ANGIOINVASIVE TYPE (4 CM) - THE TUMOR IS CONFINED TO THE THYROID GLAND. - FOCAL ANGIOINVASION IS PRESENT. - NO LYMPHATIC INVASION IS IDENTIFIED - TUMOR EXTENDS TO THE INKED SPECIMEN SURFACE IN AN AREA OF CAPSULAR DISRUPTION. - BACKGROUND MULTINODULAR HYPERPLASIA.  B.  RIGHT THYROID; HEMITHYROIDECTOMY: - MULTINODULAR HYPERPLASIA (FOLLICULAR NODULAR DISEASE).  Comment: The slides were reviewed at [facility] of pathology.  We agree with the above diagnosis issued at [facility]. They also added a note which we agree with-"the left lobe is notable for a dominant nodule that is thinly encapsulated throughout and is composed of a uniform population of oncocytic cells with abundant eosinophilic granular cytoplasm and round to oval nuclei with prominent nucleoli. Although it is difficult to evaluate for invasion in this case due to extensive displacement of degenerating tumor cells, I think there were two (2) well-developed foci where endothelialized plugs of tumor cells extend into intracapsular blood vessels and are adherent to the vessel walls (blocks A9-A13), supporting the presence of angioinvasion.  There are additional foci of capsular invasion.  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 Labeled: Left hemithyroid suture left upper pole Received: Fresh Collection time: 8:50 AM on 04/05/2022 Placed into formalin time: 10:17 AM on 04/05/2022 Type of procedure: Total thyroidectomy Weight of specimen: 31.4 grams Size: of specimen: 6.0 (superior to inferior) by 4.2 (medial to lateral) by 3.0 (anterior to posterior) cm Orientation: Oriented with a suture at the upper pole.  The external surface is inked black and the isthmus margin is inked orange Number of masses: 1 Location of mass(es): Inferior pole  Size(s) of mass(es): 4.0 x 3.5 x 3.5 cm Description of mass(es): Tan-gray,  soft fleshy friable and encapsulated Confinement to thyroid: Grossly confined to the thyroid Margins: 0.1 cm to black ink, 0.3 cm to orange ink Description of remainder of the thyroid: The remaining thyroid is dark red and spongy Parathyroids: None grossly identified Lymph nodes: None grossly identified  Block summary: 1 -representative unremarkable thyroid and upper pole 2-18-capsule of mass, entirely submitted with closest isthmus margin in 9  B. Labeled: Right hemithyroid, Vicryl right  upper pole Received: Fresh Collection time: 9:35 AM on 04/05/2022 Placed into formalin time: 10:17 AM on 04/05/2022 Type of procedure: Total thyroidectomy Weight of specimen: 13.9 grams Size: of specimen: 5.0 (superior to inferior) by 3.5 (medial to lateral) by 1.9 (anterior to posterior) cm Orientation: Oriented with a suture at the upper pole.  The entire specimen is inked blue Number of masses: 1 Location of mass(es): Inferior pole  Size(s) of mass(es): 0.9 x 0.5 x 0.5 cm Description of mass(es): Clear, cystic and well-circumscribed Confinement to thyroid: Confined to the thyroid Margins: 0.3 cm from the margin Description of remainder of the thyroid: The remaining thyroid is red and spongy Parathyroids: None grossly identified Lymph nodes: None grossly identified   # positive nodes:  0 # negative nodes: 0    TNM staging: pT:         PN:         Mx:    ADDITIONAL PHYSICIAN COMMENTS/NOTES Encapsulated Hurthle cell tumor with angioinvasion.  Positive margin. 4 cm lesion - 67 year old female  AUTHORIZED [place]: Rennis Golden, MD   06/01/22    8:52 PM

## 2022-06-10 ENCOUNTER — Other Ambulatory Visit: Payer: Medicare Other

## 2022-06-29 ENCOUNTER — Encounter
Admission: RE | Admit: 2022-06-29 | Discharge: 2022-06-29 | Disposition: A | Discharge status: Home / Self Care | Payer: Medicare Other | Source: Ambulatory Visit | Attending: Internal Medicine | Admitting: Internal Medicine

## 2022-06-29 DIAGNOSIS — C73 Malignant neoplasm of thyroid gland: Secondary | ICD-10-CM | POA: Insufficient documentation

## 2022-06-29 MED ORDER — THYROTROPIN ALFA 0.9 MG IM SOLR
0.9000 mg | INTRAMUSCULAR | Status: AC
  Administered 2022-06-29: 0.9 mg via INTRAMUSCULAR

## 2022-06-30 ENCOUNTER — Encounter
Admission: RE | Admit: 2022-06-30 | Discharge: 2022-06-30 | Disposition: A | Discharge status: Home / Self Care | Payer: Medicare Other | Source: Ambulatory Visit | Attending: Internal Medicine | Admitting: Internal Medicine

## 2022-06-30 DIAGNOSIS — C73 Malignant neoplasm of thyroid gland: Secondary | ICD-10-CM | POA: Diagnosis not present

## 2022-06-30 MED ORDER — THYROTROPIN ALFA 0.9 MG IM SOLR
0.9000 mg | INTRAMUSCULAR | Status: AC
  Administered 2022-06-30: 0.9 mg via INTRAMUSCULAR

## 2022-07-01 ENCOUNTER — Encounter
Admission: RE | Admit: 2022-07-01 | Discharge: 2022-07-01 | Disposition: A | Discharge status: Home / Self Care | Payer: Medicare Other | Source: Ambulatory Visit | Attending: Internal Medicine | Admitting: Internal Medicine

## 2022-07-01 MED ORDER — SODIUM IODIDE I 131 CAPSULE
110.7600 | Freq: Once | INTRAVENOUS | Status: AC
  Administered 2022-07-01: 110.76 via ORAL

## 2022-07-08 ENCOUNTER — Encounter
Admission: RE | Admit: 2022-07-08 | Discharge: 2022-07-08 | Disposition: A | Discharge status: Home / Self Care | Payer: Medicare Other | Source: Ambulatory Visit | Attending: Internal Medicine | Admitting: Internal Medicine

## 2022-07-08 DIAGNOSIS — C73 Malignant neoplasm of thyroid gland: Secondary | ICD-10-CM | POA: Diagnosis present

## 2022-10-20 ENCOUNTER — Other Ambulatory Visit: Payer: Self-pay | Admitting: Family Medicine

## 2022-10-20 DIAGNOSIS — N6489 Other specified disorders of breast: Secondary | ICD-10-CM

## 2022-10-20 DIAGNOSIS — R002 Palpitations: Secondary | ICD-10-CM

## 2022-10-20 DIAGNOSIS — R590 Localized enlarged lymph nodes: Secondary | ICD-10-CM

## 2022-10-26 ENCOUNTER — Ambulatory Visit
Admission: RE | Admit: 2022-10-26 | Discharge: 2022-10-26 | Disposition: A | Discharge status: Home / Self Care | Payer: Medicare Other | Source: Ambulatory Visit | Attending: Family Medicine | Admitting: Family Medicine

## 2022-10-26 ENCOUNTER — Other Ambulatory Visit: Payer: Self-pay | Admitting: Family Medicine

## 2022-10-26 DIAGNOSIS — R002 Palpitations: Secondary | ICD-10-CM | POA: Insufficient documentation

## 2022-10-26 DIAGNOSIS — R590 Localized enlarged lymph nodes: Secondary | ICD-10-CM | POA: Diagnosis present

## 2022-10-26 DIAGNOSIS — N6489 Other specified disorders of breast: Secondary | ICD-10-CM | POA: Insufficient documentation

## 2022-11-29 ENCOUNTER — Inpatient Hospital Stay: Payer: Medicare Other | Attending: Oncology | Admitting: Oncology

## 2022-11-29 ENCOUNTER — Inpatient Hospital Stay: Payer: Medicare Other

## 2022-11-29 ENCOUNTER — Encounter: Payer: Self-pay | Admitting: Oncology

## 2022-11-29 VITALS — BP 125/67 | HR 60 | Temp 97.6°F | Resp 18 | Ht 66.0 in | Wt 228.4 lb

## 2022-11-29 DIAGNOSIS — Z853 Personal history of malignant neoplasm of breast: Secondary | ICD-10-CM

## 2022-11-29 DIAGNOSIS — R140 Abdominal distension (gaseous): Secondary | ICD-10-CM

## 2022-11-29 DIAGNOSIS — N6489 Other specified disorders of breast: Secondary | ICD-10-CM

## 2022-11-29 DIAGNOSIS — E280 Estrogen excess: Secondary | ICD-10-CM | POA: Diagnosis present

## 2022-11-29 DIAGNOSIS — R748 Abnormal levels of other serum enzymes: Secondary | ICD-10-CM

## 2022-11-29 NOTE — Assessment & Plan Note (Signed)
Elevated estrogen level with normal estradiol level.  Denies any otc supplementation, normal FSH, LH.  Check estradiol, estrone, estriol level.  CT abdomen pelvis w contrast for evaluation of any estrogen secreting tumor.

## 2022-11-29 NOTE — Assessment & Plan Note (Signed)
Check CT abdomen pelvis w contrast

## 2022-11-29 NOTE — Assessment & Plan Note (Signed)
Check hepatitis panel

## 2022-11-29 NOTE — Progress Notes (Signed)
Pt here to establish care. Pt reports that right breast has enlarged twice the size since April. She also complains of pain and swelling to right foot.

## 2022-11-29 NOTE — Assessment & Plan Note (Signed)
Physical examination and Mammogram revealed no breast mass.

## 2022-11-29 NOTE — Progress Notes (Signed)
Hematology/Oncology Consult Note [phone] [phone]     REFERRING PROVIDER: Cyndia Diver, PA-C    CHIEF COMPLAINTS/PURPOSE OF CONSULTATION:  Elevated estrogen level, breast asymmetry   ASSESSMENT & PLAN:   Estrogen increased Elevated estrogen level with normal estradiol level.  Denies any otc supplementation, normal FSH, LH.  Check estradiol, estrone, estriol level.  CT abdomen pelvis w contrast for evaluation of any estrogen secreting tumor.   History of breast cancer Physical examination and Mammogram revealed no breast mass.   Elevated alkaline phosphatase level Check hepatitis panel  Bloated abdomen Check CT abdomen pelvis w contrast    Orders Placed This Encounter  Procedures   CT ABDOMEN PELVIS W CONTRAST    Standing Status:   Future    Standing Expiration Date:   11/29/2023    Order Specific Question:   If indicated for the ordered procedure, I authorize the administration of contrast media per Radiology protocol    Answer:   Yes    Order Specific Question:   Does the patient have a contrast media/X-ray dye allergy?    Answer:   No    Order Specific Question:   Preferred imaging location?    Answer:   [facility]    Order Specific Question:   If indicated for the ordered procedure, I authorize the administration of oral contrast media per Radiology protocol    Answer:   Yes   Estradiol    Standing Status:   Future    Number of Occurrences:   1    Standing Expiration Date:   11/29/2023   Follicle stimulating hormone    Standing Status:   Future    Number of Occurrences:   1    Standing Expiration Date:   11/29/2023   Miscellaneous [facility] test (send-out)    Standing Status:   Future    Number of Occurrences:   1    Standing Expiration Date:   11/29/2023    Order Specific Question:   Test name / description:    Answer:   [id number] -Estrone   Miscellaneous [facility] test (send-out)    Standing Status:   Future    Number of  Occurrences:   1    Standing Expiration Date:   11/29/2023    Order Specific Question:   Test name / description:    Answer:   [id number]    All questions were answered. The patient knows to call the clinic with any problems, questions or concerns.  Rickard Patience, MD, PhD [facility] Hematology Oncology 11/29/2022    HISTORY OF PRESENTING ILLNESS:  Ariana Lopez 67 y.o. female presents to establish care for elevated estrogen level, right breast enlargement and tenderness She has a history of left breast cancer, she recalls that breast cancer was diagnosed in [place] in 2002, s/p left lumpectomy and lymph node removal. She also received adjuvant chemotherapy and radiation. She did not receive endocrinology therapy.   She reports that her left breast has always bene slightly decreased since surgery.  Recently, since April 2024, she has noticed sudden onset of right breast enlargement, now "twice the size of my left breast".  She also has gained weight. She endorses abdominal bloating.  Denies any vaginal bleeding, abdominal pain.  Family history of breast cancer.   10/26/2022 bilateral diagnostic mammogram and right axilla US showed 1. No mammographic or sonographic evidence of malignancy in the right breast or axilla. 2. No mammographic evidence of malignancy in the left breast.  11/07/2022 Estradiol 28 estergeon 543 11/24/2022 FSH 99.5, LF 45.9, estrogen level 252  MEDICAL HISTORY:  Past Medical History:  Diagnosis Date   Allergy    Arthritis    Asthma    Breast cancer (HCC)    Breast cancer, left (HCC) 2005   Lumpectomy and sentinel lymph nodes resected, chemo + rad tx's.    Complication of anesthesia    states that she has had past issues with HA   Headache    History of thyroid nodule    History of tinnitus    Low vitamin D level 07/21/2019   Obesity (BMI 30.0-34.9) 11/13/2012   Personal history of chemotherapy    Personal history of radiation therapy    Pneumonia     PONV (postoperative nausea and vomiting)    Sleep apnea    Thyroid cancer (HCC)    Thyroid nodule 07/21/2019    SURGICAL HISTORY: Past Surgical History:  Procedure Laterality Date   BREAST BIOPSY     BREAST LUMPECTOMY Left    BREAST SURGERY     CESAREAN SECTION  2000   COLONOSCOPY WITH PROPOFOL N/A 06/10/2016   Procedure: COLONOSCOPY WITH PROPOFOL;  Surgeon: Wyline Mood, MD;  Location: [facility] ENDOSCOPY;  Service: Endoscopy;  Laterality: N/A;   CONTINUOUS NERVE MONITORING Bilateral 04/05/2022   Procedure: CONTINUOUS NERVE MONITORING;  Surgeon: Bud Face, MD;  Location: [facility] ORS;  Service: ENT;  Laterality: Bilateral;   DILATION AND CURETTAGE OF UTERUS     left breast lumpectomy Left 2002   MYOMECTOMY     THYROIDECTOMY Bilateral 04/05/2022   Procedure: TOTAL THYROIDECTOMY;  Surgeon: Bud Face, MD;  Location: [facility] ORS;  Service: ENT;  Laterality: Bilateral;   uterine lift     ballooning    SOCIAL HISTORY: Social History   Socioeconomic History   Marital status: Married    Spouse name: Reuel Boom   Number of children: Not on file   Years of education: Not on file   Highest education level: Not on file  Occupational History   Not on file  Tobacco Use   Smoking status: Former    Current packs/day: 0.00    Types: Cigarettes    Quit date: 2004    Years since quitting: 20.6   Smokeless tobacco: Never  Vaping Use   Vaping status: Never Used  Substance and Sexual Activity   Alcohol use: Not Currently    Comment: occ   Drug use: No   Sexual activity: Not Currently  Other Topics Concern   Not on file  Social History Narrative   G1P1   Married.   Recently moved here from [place].            Social Determinants of Health   Financial Resource Strain: Low Risk  (11/21/2022)   Received from [facility]   Overall Financial Resource Strain (CARDIA)    Difficulty of Paying Living Expenses: Not hard at all  Food Insecurity: No Food Insecurity  (11/29/2022)   Hunger Vital Sign    Worried About Running Out of Food in the Last Year: Never true    Ran Out of Food in the Last Year: Never true  Transportation Needs: No Transportation Needs (11/29/2022)   PRAPARE - Administrator, Civil Service (Medical): No    Lack of Transportation (Non-Medical): No  Physical Activity: Not on file  Stress: Not on file  Social Connections: Not on file  Intimate Partner Violence: Not At Risk (11/29/2022)  Humiliation, Afraid, Rape, and Kick questionnaire    Fear of Current or Ex-Partner: No    Emotionally Abused: No    Physically Abused: No    Sexually Abused: No    FAMILY HISTORY: Family History  Problem Relation Age of Onset   Stroke Mother    Heart disease Father    Hypertension Father    Cancer Sister 100       breast   Breast cancer Sister    Heart attack Brother    Prostate cancer Brother     ALLERGIES:  is allergic to augmentin [amoxicillin-pot clavulanate] and sulfa antibiotics.  MEDICATIONS:  Current Outpatient Medications  Medication Sig Dispense Refill   aspirin EC 81 MG tablet Take 81 mg by mouth daily. Swallow whole.     azelastine (ASTELIN) 0.1 % nasal spray Place 2 sprays into both nostrils at bedtime as needed for rhinitis. 30 mL 6   folic acid (FOLVITE) 1 MG tablet Take 1 mg by mouth daily.     levothyroxine (SYNTHROID) 150 MCG tablet Take 150 mcg by mouth daily before breakfast.     methotrexate (RHEUMATREX) 2.5 MG tablet Take 20 mg by mouth once a week.     RINVOQ 15 MG TB24 Take 15 mg by mouth daily.     rosuvastatin (CRESTOR) 5 MG tablet Take 1 tablet (5 mg total) by mouth daily. 90 tablet 3   No current facility-administered medications for this visit.    Review of Systems  Constitutional:  Negative for appetite change, chills, fatigue and fever.  HENT:   Negative for hearing loss and voice change.   Eyes:  Negative for eye problems.  Respiratory:  Negative for chest tightness and cough.    Cardiovascular:  Negative for chest pain.  Gastrointestinal:  Negative for abdominal pain and blood in stool.       Abdominal bloating.   Endocrine: Negative for hot flashes.  Genitourinary:  Negative for difficulty urinating and frequency.   Musculoskeletal:  Negative for arthralgias.  Skin:  Negative for itching and rash.  Neurological:  Negative for extremity weakness.  Hematological:  Negative for adenopathy.  Psychiatric/Behavioral:  Negative for confusion.      PHYSICAL EXAMINATION: ECOG PERFORMANCE STATUS: 1 - Symptomatic but completely ambulatory  Vitals:   11/29/22 1114  BP: 125/67  Pulse: 60  Resp: 18  Temp: 97.6 F (36.4 C)   Filed Weights   11/29/22 1114  Weight: 228 lb 6.4 oz (103.6 kg)    Physical Exam Constitutional:      General: She is not in acute distress.    Appearance: She is not diaphoretic.  HENT:     Head: Normocephalic and atraumatic.     Nose: Nose normal.     Mouth/Throat:     Pharynx: No oropharyngeal exudate.  Eyes:     General: No scleral icterus.    Pupils: Pupils are equal, round, and reactive to light.  Cardiovascular:     Rate and Rhythm: Normal rate and regular rhythm.     Heart sounds: No murmur heard. Pulmonary:     Effort: Pulmonary effort is normal. No respiratory distress.     Breath sounds: No rales.  Chest:     Chest wall: No tenderness.  Abdominal:     General: There is no distension.     Palpations: Abdomen is soft.     Tenderness: There is no abdominal tenderness.  Musculoskeletal:        General: Normal range of motion.  Cervical back: Normal range of motion and neck supple.  Skin:    General: Skin is warm and dry.     Findings: No erythema.  Neurological:     Mental Status: She is alert and oriented to person, place, and time.     Cranial Nerves: No cranial nerve deficit.     Motor: No abnormal muscle tone.     Coordination: Coordination normal.  Psychiatric:        Mood and Affect: Affect normal.      Breast exam was performed in seated and lying down position. Patient is status post left lumpectomy with a well-healed surgical scar.   No palpable breast masses bilaterally.  No palpable axillary adenopathy bilaterally.  Right breast size is significantly larger than left breast. No nipple discharges.   LABORATORY DATA:  I have reviewed the data as listed    Latest Ref Rng & Units 06/13/2019    9:57 AM 05/19/2016    9:54 AM  CBC  WBC 4.0 - 10.5 K/uL 5.2  5.4   Hemoglobin 12.0 - 15.0 g/dL 25.3  66.4   Hematocrit 36.0 - 46.0 % 39.3  40.6   Platelets 150.0 - 400.0 K/uL 215.0  250.0       Latest Ref Rng & Units 04/06/2022    6:29 AM 04/05/2022    3:23 PM 04/05/2022   10:36 AM  CMP  Calcium 8.9 - 10.3 mg/dL 8.7  8.9  8.5      RADIOGRAPHIC STUDIES: I have personally reviewed the radiological images as listed and agreed with the findings in the report. No results found.

## 2022-11-30 LAB — FOLLICLE STIMULATING HORMONE: FSH: 102 m[IU]/mL (ref 25.8–134.8)

## 2022-11-30 LAB — ESTRADIOL: Estradiol: 32.8 pg/mL (ref 0.0–54.7)

## 2022-12-01 LAB — MISC LABCORP TEST (SEND OUT)
Labcorp test code: 4614
Labcorp test code: 4564

## 2022-12-07 ENCOUNTER — Ambulatory Visit
Admission: RE | Admit: 2022-12-07 | Discharge: 2022-12-07 | Disposition: A | Discharge status: Home / Self Care | Payer: Medicare Other | Source: Ambulatory Visit | Attending: Oncology | Admitting: Oncology

## 2022-12-07 DIAGNOSIS — E280 Estrogen excess: Secondary | ICD-10-CM | POA: Diagnosis present

## 2022-12-07 DIAGNOSIS — R140 Abdominal distension (gaseous): Secondary | ICD-10-CM | POA: Insufficient documentation

## 2022-12-07 LAB — POCT I-STAT CREATININE: Creatinine, Ser: 0.8 mg/dL (ref 0.44–1.00)

## 2022-12-07 MED ORDER — IOHEXOL 300 MG/ML  SOLN
100.0000 mL | Freq: Once | INTRAMUSCULAR | Status: AC | PRN
  Administered 2022-12-07: 100 mL via INTRAVENOUS

## 2022-12-14 ENCOUNTER — Encounter: Payer: Self-pay | Admitting: Oncology

## 2022-12-14 ENCOUNTER — Inpatient Hospital Stay (HOSPITAL_BASED_OUTPATIENT_CLINIC_OR_DEPARTMENT_OTHER): Payer: Medicare Other | Admitting: Oncology

## 2022-12-14 VITALS — BP 137/53 | HR 69 | Temp 97.8°F | Resp 18 | Wt 227.8 lb

## 2022-12-14 DIAGNOSIS — E280 Estrogen excess: Secondary | ICD-10-CM

## 2022-12-14 DIAGNOSIS — Z853 Personal history of malignant neoplasm of breast: Secondary | ICD-10-CM

## 2022-12-14 DIAGNOSIS — N62 Hypertrophy of breast: Secondary | ICD-10-CM

## 2022-12-14 NOTE — Assessment & Plan Note (Signed)
Elevated estrogen level with normal estradiol level.  Denies any otc supplementation, normal FSH, LH.  She has normal estradiol, estrone, estriol level.  CT abdomen pelvis w contrast did not show any adrenal mass or other acute process to explain elevated estrogen level.

## 2022-12-14 NOTE — Progress Notes (Signed)
Hematology/Oncology Consult Note [phone] [phone]     REFERRING PROVIDER: Cyndia Diver, PA-C    CHIEF COMPLAINTS/PURPOSE OF CONSULTATION:  Elevated estrogen level, breast asymmetry   ASSESSMENT & PLAN:   History of breast cancer Physical examination and Mammogram revealed no breast mass.  She will continue screening mammogram annually.  Estrogen increased Elevated estrogen level with normal estradiol level.  Denies any otc supplementation, normal FSH, LH.  She has normal estradiol, estrone, estriol level.  CT abdomen pelvis w contrast did not show any adrenal mass or other acute process to explain elevated estrogen level.  Gynecomastia, female Current imaging suggest no evidence of adrenocortical tumor Estradiol level is normal.  The tenderness is likely secondary to gynecomastia.  Normal mammogram We discussed about option of obtaining MRI breast and referring to breast surgeon for further evaluation.  Patient declined. Recommend patient to continue follow-up with endocrinology for further workup, rule out thyrotoxicosis,  Follow-up as needed.    All questions were answered. The patient knows to call the clinic with any problems, questions or concerns.  Rickard Patience, MD, PhD [facility] Hematology Oncology 12/14/2022    HISTORY OF PRESENTING ILLNESS:  Ariana Lopez 67 y.o. female presents to establish care for elevated estrogen level, right breast enlargement and tenderness She has a history of left breast cancer, she recalls that breast cancer was diagnosed in [place] in 2002, s/p left lumpectomy and lymph node removal. She also received adjuvant chemotherapy and radiation. She did not receive endocrinology therapy.   She reports that her left breast has always bene slightly decreased since surgery.  Recently, since April 2024, she has noticed sudden onset of right breast enlargement, now "twice the size of my left breast".  She also  has gained weight. She endorses abdominal bloating.  Denies any vaginal bleeding, abdominal pain.  Family history of breast cancer.   10/26/2022 bilateral diagnostic mammogram and right axilla US showed 1. No mammographic or sonographic evidence of malignancy in the right breast or axilla. 2. No mammographic evidence of malignancy in the left breast.  11/07/2022 Estradiol 28 estergeon 543 11/24/2022 FSH 99.5, LF 45.9, estrogen level 252  INTERVAL HISTORY Ariana Lopez is a 67 y.o. female who has above history reviewed by me today presents for follow up visit for elevated estrogen, gynecomastia Patient presents to discuss results.  MEDICAL HISTORY:  Past Medical History:  Diagnosis Date   Allergy    Arthritis    Asthma    Breast cancer (HCC)    Breast cancer, left (HCC) 2005   Lumpectomy and sentinel lymph nodes resected, chemo + rad tx's.    Complication of anesthesia    states that she has had past issues with HA   Headache    History of thyroid nodule    History of tinnitus    Low vitamin D level 07/21/2019   Obesity (BMI 30.0-34.9) 11/13/2012   Personal history of chemotherapy    Personal history of radiation therapy    Pneumonia    PONV (postoperative nausea and vomiting)    Sleep apnea    Thyroid cancer (HCC)    Thyroid nodule 07/21/2019    SURGICAL HISTORY: Past Surgical History:  Procedure Laterality Date   BREAST BIOPSY     BREAST LUMPECTOMY Left    BREAST SURGERY     CESAREAN SECTION  2000   COLONOSCOPY WITH PROPOFOL N/A 06/10/2016   Procedure: COLONOSCOPY WITH PROPOFOL;  Surgeon: Wyline Mood, MD;  Location: [facility] ENDOSCOPY;  Service: Endoscopy;  Laterality: N/A;   CONTINUOUS NERVE MONITORING Bilateral 04/05/2022   Procedure: CONTINUOUS NERVE MONITORING;  Surgeon: Bud Face, MD;  Location: [facility] ORS;  Service: ENT;  Laterality: Bilateral;   DILATION AND CURETTAGE OF UTERUS     left breast lumpectomy Left 2002   MYOMECTOMY     THYROIDECTOMY Bilateral  04/05/2022   Procedure: TOTAL THYROIDECTOMY;  Surgeon: Bud Face, MD;  Location: [facility] ORS;  Service: ENT;  Laterality: Bilateral;   uterine lift     ballooning    SOCIAL HISTORY: Social History   Socioeconomic History   Marital status: Married    Spouse name: Reuel Boom   Number of children: Not on file   Years of education: Not on file   Highest education level: Not on file  Occupational History   Not on file  Tobacco Use   Smoking status: Former    Current packs/day: 0.00    Types: Cigarettes    Quit date: 2004    Years since quitting: 20.7   Smokeless tobacco: Never  Vaping Use   Vaping status: Never Used  Substance and Sexual Activity   Alcohol use: Not Currently    Comment: occ   Drug use: No   Sexual activity: Not Currently  Other Topics Concern   Not on file  Social History Narrative   G1P1   Married.   Recently moved here from [place].            Social Determinants of Health   Financial Resource Strain: Low Risk  (11/21/2022)   Received from [facility]   Overall Financial Resource Strain (CARDIA)    Difficulty of Paying Living Expenses: Not hard at all  Food Insecurity: No Food Insecurity (11/29/2022)   Hunger Vital Sign    Worried About Running Out of Food in the Last Year: Never true    Ran Out of Food in the Last Year: Never true  Transportation Needs: No Transportation Needs (11/29/2022)   PRAPARE - Administrator, Civil Service (Medical): No    Lack of Transportation (Non-Medical): No  Physical Activity: Not on file  Stress: Not on file  Social Connections: Not on file  Intimate Partner Violence: Not At Risk (11/29/2022)   Humiliation, Afraid, Rape, and Kick questionnaire    Fear of Current or Ex-Partner: No    Emotionally Abused: No    Physically Abused: No    Sexually Abused: No    FAMILY HISTORY: Family History  Problem Relation Age of Onset   Stroke Mother    Heart disease Father    Hypertension Father     Cancer Sister 47       breast   Breast cancer Sister    Heart attack Brother    Prostate cancer Brother     ALLERGIES:  is allergic to augmentin [amoxicillin-pot clavulanate] and sulfa antibiotics.  MEDICATIONS:  Current Outpatient Medications  Medication Sig Dispense Refill   aspirin EC 81 MG tablet Take 81 mg by mouth daily. Swallow whole.     azelastine (ASTELIN) 0.1 % nasal spray Place 2 sprays into both nostrils at bedtime as needed for rhinitis. 30 mL 6   folic acid (FOLVITE) 1 MG tablet Take 1 mg by mouth daily.     levothyroxine (SYNTHROID) 150 MCG tablet Take 150 mcg by mouth daily before breakfast.     methotrexate (RHEUMATREX) 2.5 MG tablet Take 20 mg by mouth once a week.     RINVOQ  15 MG TB24 Take 15 mg by mouth daily.     rosuvastatin (CRESTOR) 5 MG tablet Take 1 tablet (5 mg total) by mouth daily. 90 tablet 3   predniSONE (DELTASONE) 10 MG tablet Take by mouth. take 1 tablet (10 mg total) by mouth once daily 6, 5, 4, 3, 2,1, off (Patient not taking: Reported on 12/14/2022)     No current facility-administered medications for this visit.    Review of Systems  Constitutional:  Negative for appetite change, chills, fatigue and fever.  HENT:   Negative for hearing loss and voice change.   Eyes:  Negative for eye problems.  Respiratory:  Negative for chest tightness and cough.   Cardiovascular:  Negative for chest pain.  Gastrointestinal:  Negative for abdominal pain and blood in stool.       Abdominal bloating.   Endocrine: Negative for hot flashes.  Genitourinary:  Negative for difficulty urinating and frequency.   Musculoskeletal:  Negative for arthralgias.  Skin:  Negative for itching and rash.  Neurological:  Negative for extremity weakness.  Hematological:  Negative for adenopathy.  Psychiatric/Behavioral:  Negative for confusion.      PHYSICAL EXAMINATION: ECOG PERFORMANCE STATUS: 1 - Symptomatic but completely ambulatory  Vitals:   12/14/22 1448  BP:  (!) 137/53  Pulse: 69  Resp: 18  Temp: 97.8 F (36.6 C)   Filed Weights   12/14/22 1448  Weight: 227 lb 12.8 oz (103.3 kg)    Physical Exam Constitutional:      General: She is not in acute distress.    Appearance: She is not diaphoretic.  HENT:     Head: Normocephalic and atraumatic.     Nose: Nose normal.     Mouth/Throat:     Pharynx: No oropharyngeal exudate.  Eyes:     General: No scleral icterus.    Pupils: Pupils are equal, round, and reactive to light.  Cardiovascular:     Rate and Rhythm: Normal rate and regular rhythm.     Heart sounds: No murmur heard. Pulmonary:     Effort: Pulmonary effort is normal. No respiratory distress.     Breath sounds: No rales.  Chest:     Chest wall: No tenderness.  Abdominal:     General: There is no distension.     Palpations: Abdomen is soft.     Tenderness: There is no abdominal tenderness.  Musculoskeletal:        General: Normal range of motion.     Cervical back: Normal range of motion and neck supple.  Skin:    General: Skin is warm and dry.     Findings: No erythema.  Neurological:     Mental Status: She is alert and oriented to person, place, and time.     Cranial Nerves: No cranial nerve deficit.     Motor: No abnormal muscle tone.     Coordination: Coordination normal.  Psychiatric:        Mood and Affect: Affect normal.     LABORATORY DATA:  I have reviewed the data as listed    Latest Ref Rng & Units 06/13/2019    9:57 AM 05/19/2016    9:54 AM  CBC  WBC 4.0 - 10.5 K/uL 5.2  5.4   Hemoglobin 12.0 - 15.0 g/dL 10.2  72.5   Hematocrit 36.0 - 46.0 % 39.3  40.6   Platelets 150.0 - 400.0 K/uL 215.0  250.0       Latest Ref Rng & Units  12/07/2022    2:18 PM 04/06/2022    6:29 AM 04/05/2022    3:23 PM  CMP  Creatinine 0.44 - 1.00 mg/dL 0.98     Calcium 8.9 - 10.3 mg/dL  8.7  8.9      RADIOGRAPHIC STUDIES: I have personally reviewed the radiological images as listed and agreed with the findings in the  report. CT ABDOMEN PELVIS W CONTRAST  Result Date: 12/13/2022 CLINICAL DATA:  Abdominal bloating EXAM: CT ABDOMEN AND PELVIS WITH CONTRAST TECHNIQUE: Multidetector CT imaging of the abdomen and pelvis was performed using the standard protocol following bolus administration of intravenous contrast. RADIATION DOSE REDUCTION: This exam was performed according to the departmental dose-optimization program which includes automated exposure control, adjustment of the mA and/or kV according to patient size and/or use of iterative reconstruction technique. CONTRAST:  OMNIPAQUE IOHEXOL 300 MG/ML  SOLN COMPARISON:  None Available. FINDINGS: Lower chest: Mild atelectasis.  No acute abnormality. Hepatobiliary: No focal liver abnormality is seen. No gallstones, gallbladder wall thickening, or biliary dilatation. Pancreas: Unremarkable. No pancreatic ductal dilatation or surrounding inflammatory changes. Spleen: Normal in size without focal abnormality. Adrenals/Urinary Tract: Bilateral adrenal glands are unremarkable. No hydronephrosis. Low-attenuation lesion of the lower pole of the right kidney which is too small to accurately characterize but likely a simple cyst. Bladder is unremarkable. Stomach/Bowel: Stomach is within normal limits. Appendix appears normal. No evidence of bowel wall thickening, distention, or inflammatory changes. Vascular/Lymphatic: Aortic atherosclerosis. No enlarged abdominal or pelvic lymph nodes. Reproductive: Uterus and bilateral adnexa are unremarkable. Other: No abdominal wall hernia or abnormality. No abdominopelvic ascites. Musculoskeletal: No acute or significant osseous findings. IMPRESSION: 1. No acute findings in the abdomen or pelvis. 2. Mild aortic Atherosclerosis (ICD10-I70.0). Electronically Signed   By: Allegra Lai M.D.   On: 12/13/2022 16:58

## 2022-12-14 NOTE — Assessment & Plan Note (Addendum)
Physical examination and Mammogram revealed no breast mass.  She will continue screening mammogram annually.

## 2022-12-14 NOTE — Assessment & Plan Note (Addendum)
Current imaging suggest no evidence of adrenocortical tumor Estradiol level is normal.  The tenderness is likely secondary to gynecomastia.  Normal mammogram We discussed about option of obtaining MRI breast and referring to breast surgeon for further evaluation.  Patient declined. Recommend patient to continue follow-up with endocrinology for further workup, rule out thyrotoxicosis,

## 2022-12-14 NOTE — Progress Notes (Signed)
Pt here for follow up. No new concerns voiced.

## 2023-02-08 NOTE — Telephone Encounter (Signed)
Error

## 2023-07-12 ENCOUNTER — Emergency Department
Admission: EM | Admit: 2023-07-12 | Discharge: 2023-07-12 | Disposition: A | Discharge status: Home / Self Care | Attending: Emergency Medicine | Admitting: Emergency Medicine

## 2023-07-12 ENCOUNTER — Other Ambulatory Visit: Payer: Self-pay

## 2023-07-12 ENCOUNTER — Emergency Department

## 2023-07-12 DIAGNOSIS — S0990XA Unspecified injury of head, initial encounter: Secondary | ICD-10-CM | POA: Diagnosis present

## 2023-07-12 DIAGNOSIS — W01198A Fall on same level from slipping, tripping and stumbling with subsequent striking against other object, initial encounter: Secondary | ICD-10-CM | POA: Insufficient documentation

## 2023-07-12 DIAGNOSIS — J45909 Unspecified asthma, uncomplicated: Secondary | ICD-10-CM | POA: Insufficient documentation

## 2023-07-12 DIAGNOSIS — S0083XA Contusion of other part of head, initial encounter: Secondary | ICD-10-CM | POA: Diagnosis not present

## 2023-07-12 DIAGNOSIS — Z853 Personal history of malignant neoplasm of breast: Secondary | ICD-10-CM | POA: Diagnosis not present

## 2023-07-12 DIAGNOSIS — W19XXXA Unspecified fall, initial encounter: Secondary | ICD-10-CM

## 2023-07-12 NOTE — ED Triage Notes (Signed)
 Here due to a fall she had on this past Friday, pt states she was sent by PCP for CT scan. Pt states mechanical fall; due to tripping over boxes, pt denies LOC. Black eye noted to L eye. Pt denies blood thinner use. Pt is A&Ox4.

## 2023-07-12 NOTE — Discharge Instructions (Signed)
 Your exam and CT scans are reassuring serious closed head or facial.  You have an orbital contusion, otherwise known as a "black eye".  It can take a few weeks for the bruising to completely resolve.  Take OTC Tylenol and Motrin as needed.  Follow-up with your primary provider for ongoing evaluation.  Return to the ED if necessary.

## 2023-07-12 NOTE — ED Provider Notes (Signed)
 [facility] Emergency Department Provider Note     Event Date/Time   First MD Initiated Contact with Patient 07/12/23 1516     (approximate)   History   Head Injury   HPI  Ariana Lopez is a 68 y.o. female with a history of obesity, sleep apnea, asthma, and breast cancer, presents to the ED following mechanical fall on Friday, 5 days prior to arrival.  She endorses tripping over boxes in her home, hitting her face but she does not endorse any LOC.  She presents with a black eye on the left and denies any vision change, vertigo, dizziness, weakness.  She denies any blood thinner use.  No other injury reported at this time.  Presented at this time for evaluation of her injuries.  Physical Exam   Triage Vital Signs: ED Triage Vitals [07/12/23 1503]  Encounter Vitals Group     BP (!) 146/98     Systolic BP Percentile      Diastolic BP Percentile      Pulse Rate 69     Resp 18     Temp 98 F (36.7 C)     Temp Source Oral     SpO2 97 %     Weight 220 lb (99.8 kg)     Height 5\' 6"  (1.676 m)     Head Circumference      Peak Flow      Pain Score 4     Pain Loc      Pain Education      Exclude from Growth Chart     Most recent vital signs: Vitals:   07/12/23 1503 07/12/23 1638  BP: (!) 146/98 (!) 160/85  Pulse: 69 65  Resp: 18 16  Temp: 98 F (36.7 C) 97.9 F (36.6 C)  SpO2: 97% 99%    General Awake, no distress. NAD A&O x 4 HEENT NCAT. PERRL. EOMI. conjunctiva are clear bilaterally without injection or subconjunctival hemorrhage.  Large periorbital ecchymosis noted on the left eye.  No rhinorrhea. Mucous membranes are moist.  CV:  Good peripheral perfusion.  RRR RESP:  Normal effort. CTA ABD:  No distention.  MSK:  Normal spinal alignment without midline tenderness, spasm, deformity, or step-off. NEURO: Cranial nerves II to XII grossly intact.  No cerebellar ataxia appreciated.   ED Results / Procedures / Treatments   Labs (all  labs ordered are listed, but only abnormal results are displayed) Labs Reviewed - No data to display   EKG   RADIOLOGY  I personally viewed and evaluated these images as part of my medical decision making, as well as reviewing the written report by the radiologist.  ED Provider Interpretation: No acute CT findings of the head or face  CT HEAD WO CONTRAST ( ) Result Date: 07/12/2023 CLINICAL DATA:  Fall with trauma to the head and face EXAM: CT HEAD WITHOUT CONTRAST CT MAXILLOFACIAL WITHOUT CONTRAST TECHNIQUE: Multidetector CT imaging of the head and maxillofacial structures were performed using the standard protocol without intravenous contrast. Multiplanar CT image reconstructions of the maxillofacial structures were also generated. RADIATION DOSE REDUCTION: This exam was performed according to the departmental dose-optimization program which includes automated exposure control, adjustment of the mA and/or kV according to patient size and/or use of iterative reconstruction technique. COMPARISON:  03/11/2001 FINDINGS: CT HEAD FINDINGS Brain: The brain shows a normal appearance without evidence of malformation, atrophy, old or acute small or large vessel infarction, mass lesion, hemorrhage, hydrocephalus or extra-axial  collection. Vascular: No hyperdense vessel. No evidence of atherosclerotic calcification. Skull: Normal.  No traumatic finding.  No focal bone lesion. Other: None significant CT MAXILLOFACIAL FINDINGS Osseous: No acute regional fracture. Orbits: Normal.  No orbital injury. Sinuses: Clear Soft tissues: Regional soft tissues are normal. IMPRESSION: 1. Normal head CT. 2. No acute facial fracture. Electronically Signed   By: Paulina Fusi M.D.   On: 07/12/2023 16:14   CT MAXILLOFACIAL WO CONTRAST Result Date: 07/12/2023 CLINICAL DATA:  Fall with trauma to the head and face EXAM: CT HEAD WITHOUT CONTRAST CT MAXILLOFACIAL WITHOUT CONTRAST TECHNIQUE: Multidetector CT imaging of the head and  maxillofacial structures were performed using the standard protocol without intravenous contrast. Multiplanar CT image reconstructions of the maxillofacial structures were also generated. RADIATION DOSE REDUCTION: This exam was performed according to the departmental dose-optimization program which includes automated exposure control, adjustment of the mA and/or kV according to patient size and/or use of iterative reconstruction technique. COMPARISON:  03/11/2001 FINDINGS: CT HEAD FINDINGS Brain: The brain shows a normal appearance without evidence of malformation, atrophy, old or acute small or large vessel infarction, mass lesion, hemorrhage, hydrocephalus or extra-axial collection. Vascular: No hyperdense vessel. No evidence of atherosclerotic calcification. Skull: Normal.  No traumatic finding.  No focal bone lesion. Other: None significant CT MAXILLOFACIAL FINDINGS Osseous: No acute regional fracture. Orbits: Normal.  No orbital injury. Sinuses: Clear Soft tissues: Regional soft tissues are normal. IMPRESSION: 1. Normal head CT. 2. No acute facial fracture. Electronically Signed   By: Paulina Fusi M.D.   On: 07/12/2023 16:14    PROCEDURES:  Critical Care performed: No  Procedures   MEDICATIONS ORDERED IN ED: Medications - No data to display   IMPRESSION / MDM / ASSESSMENT AND PLAN / ED COURSE  I reviewed the triage vital signs and the nursing notes.                              Differential diagnosis includes, but is not limited to, SDH, facial fracture, facial contusion, myalgias  Patient's presentation is most consistent with acute presentation with potential threat to life or bodily function.  Patient's diagnosis is consistent with facial contusion following mechanical fall.  With reassuring exam and neurowork-up at this time.  No red flags on exam.  CT imaging reviewed by me, does not reveal any acute intracranial process or facial fracture.  Patient with a stable periorbital  ecchymosis on the left eye with no visual disturbance reported or clinical concern for eye injury.  Patient will be discharged home with instructions to take her home meds as directed. Patient is to follow up with her primary provider as discussed, as needed or otherwise directed. Patient is given ED precautions to return to the ED for any worsening or new symptoms.  FINAL CLINICAL IMPRESSION(S) / ED DIAGNOSES   Final diagnoses:  Fall, initial encounter  Minor head injury, initial encounter  Contusion of face, initial encounter     Rx / DC Orders   ED Discharge Orders     None        Note:  This document was prepared using Dragon voice recognition software and may include unintentional dictation errors.    Lissa Hoard, PA-C 07/12/23 2206    Claybon Jabs, MD 07/12/23 [phone]

## 2023-10-26 ENCOUNTER — Other Ambulatory Visit: Payer: Self-pay | Admitting: Internal Medicine

## 2023-10-26 DIAGNOSIS — R0602 Shortness of breath: Secondary | ICD-10-CM

## 2023-10-26 DIAGNOSIS — R002 Palpitations: Secondary | ICD-10-CM

## 2023-11-15 ENCOUNTER — Ambulatory Visit
Admission: RE | Admit: 2023-11-15 | Discharge: 2023-11-15 | Disposition: A | Discharge status: Home / Self Care | Payer: Self-pay | Source: Ambulatory Visit | Attending: Internal Medicine | Admitting: Internal Medicine

## 2023-11-15 DIAGNOSIS — R0602 Shortness of breath: Secondary | ICD-10-CM | POA: Insufficient documentation

## 2023-11-15 DIAGNOSIS — R002 Palpitations: Secondary | ICD-10-CM | POA: Insufficient documentation

## 2024-01-29 ENCOUNTER — Other Ambulatory Visit: Payer: Self-pay | Admitting: Nurse Practitioner

## 2024-01-29 DIAGNOSIS — Z1231 Encounter for screening mammogram for malignant neoplasm of breast: Secondary | ICD-10-CM

## 2024-03-05 ENCOUNTER — Ambulatory Visit
Admission: RE | Admit: 2024-03-05 | Discharge: 2024-03-05 | Disposition: A | Source: Ambulatory Visit | Attending: Nurse Practitioner | Admitting: Nurse Practitioner

## 2024-03-05 DIAGNOSIS — Z1231 Encounter for screening mammogram for malignant neoplasm of breast: Secondary | ICD-10-CM
